# Patient Record
Sex: Female | Born: 1935 | Race: White | Hispanic: No | Smoking: Never smoker
Health system: Southern US, Community
[De-identification: ages and names within clinical notes are randomized; demographics above are authoritative.]

## PROBLEM LIST (undated history)

## (undated) DIAGNOSIS — M1990 Unspecified osteoarthritis, unspecified site: Secondary | ICD-10-CM

## (undated) DIAGNOSIS — E7800 Pure hypercholesterolemia, unspecified: Secondary | ICD-10-CM

## (undated) DIAGNOSIS — I10 Essential (primary) hypertension: Secondary | ICD-10-CM

## (undated) DIAGNOSIS — E119 Type 2 diabetes mellitus without complications: Secondary | ICD-10-CM

## (undated) HISTORY — PX: CHOLECYSTECTOMY: SHX55

---

## 2008-06-20 ENCOUNTER — Emergency Department (HOSPITAL_COMMUNITY): Admission: EM | Admit: 2008-06-20 | Discharge: 2008-06-20 | Payer: Self-pay | Admitting: Emergency Medicine

## 2008-06-28 ENCOUNTER — Ambulatory Visit (HOSPITAL_COMMUNITY): Admission: RE | Admit: 2008-06-28 | Discharge: 2008-06-28 | Payer: Self-pay | Admitting: Orthopedic Surgery

## 2010-05-04 ENCOUNTER — Encounter: Payer: Self-pay | Admitting: Orthopedic Surgery

## 2010-07-24 LAB — URINALYSIS, ROUTINE W REFLEX MICROSCOPIC
Glucose, UA: NEGATIVE mg/dL
Protein, ur: NEGATIVE mg/dL
Specific Gravity, Urine: 1.017 (ref 1.005–1.030)
Urobilinogen, UA: 0.2 mg/dL (ref 0.0–1.0)

## 2010-07-24 LAB — URINE MICROSCOPIC-ADD ON

## 2015-03-31 ENCOUNTER — Inpatient Hospital Stay (HOSPITAL_COMMUNITY): Payer: MEDICARE | Admitting: Anesthesiology

## 2015-03-31 ENCOUNTER — Encounter (HOSPITAL_COMMUNITY): Payer: Self-pay | Admitting: Emergency Medicine

## 2015-03-31 ENCOUNTER — Emergency Department (HOSPITAL_COMMUNITY): Payer: MEDICARE

## 2015-03-31 ENCOUNTER — Inpatient Hospital Stay (HOSPITAL_COMMUNITY)
Admission: EM | Admit: 2015-03-31 | Discharge: 2015-04-14 | DRG: 252 | Disposition: E | Payer: MEDICARE | Attending: Internal Medicine | Admitting: Internal Medicine

## 2015-03-31 ENCOUNTER — Encounter (HOSPITAL_COMMUNITY): Admission: EM | Disposition: E | Payer: Self-pay | Source: Home / Self Care | Attending: Pulmonary Disease

## 2015-03-31 DIAGNOSIS — I743 Embolism and thrombosis of arteries of the lower extremities: Principal | ICD-10-CM | POA: Diagnosis present

## 2015-03-31 DIAGNOSIS — I10 Essential (primary) hypertension: Secondary | ICD-10-CM | POA: Diagnosis present

## 2015-03-31 DIAGNOSIS — D649 Anemia, unspecified: Secondary | ICD-10-CM | POA: Diagnosis present

## 2015-03-31 DIAGNOSIS — R740 Nonspecific elevation of levels of transaminase and lactic acid dehydrogenase [LDH]: Secondary | ICD-10-CM | POA: Diagnosis present

## 2015-03-31 DIAGNOSIS — R0600 Dyspnea, unspecified: Secondary | ICD-10-CM | POA: Insufficient documentation

## 2015-03-31 DIAGNOSIS — M79605 Pain in left leg: Secondary | ICD-10-CM | POA: Diagnosis present

## 2015-03-31 DIAGNOSIS — E872 Acidosis: Secondary | ICD-10-CM | POA: Diagnosis present

## 2015-03-31 DIAGNOSIS — E1151 Type 2 diabetes mellitus with diabetic peripheral angiopathy without gangrene: Secondary | ICD-10-CM | POA: Diagnosis present

## 2015-03-31 DIAGNOSIS — E1165 Type 2 diabetes mellitus with hyperglycemia: Secondary | ICD-10-CM | POA: Diagnosis present

## 2015-03-31 DIAGNOSIS — N390 Urinary tract infection, site not specified: Secondary | ICD-10-CM | POA: Diagnosis present

## 2015-03-31 DIAGNOSIS — R6521 Severe sepsis with septic shock: Secondary | ICD-10-CM | POA: Diagnosis not present

## 2015-03-31 DIAGNOSIS — D72829 Elevated white blood cell count, unspecified: Secondary | ICD-10-CM | POA: Diagnosis present

## 2015-03-31 DIAGNOSIS — G9341 Metabolic encephalopathy: Secondary | ICD-10-CM | POA: Diagnosis not present

## 2015-03-31 DIAGNOSIS — B9620 Unspecified Escherichia coli [E. coli] as the cause of diseases classified elsewhere: Secondary | ICD-10-CM | POA: Diagnosis present

## 2015-03-31 DIAGNOSIS — A419 Sepsis, unspecified organism: Secondary | ICD-10-CM | POA: Diagnosis present

## 2015-03-31 DIAGNOSIS — N170 Acute kidney failure with tubular necrosis: Secondary | ICD-10-CM | POA: Diagnosis not present

## 2015-03-31 DIAGNOSIS — I350 Nonrheumatic aortic (valve) stenosis: Secondary | ICD-10-CM | POA: Diagnosis not present

## 2015-03-31 DIAGNOSIS — Z7982 Long term (current) use of aspirin: Secondary | ICD-10-CM

## 2015-03-31 DIAGNOSIS — I7090 Unspecified atherosclerosis: Secondary | ICD-10-CM

## 2015-03-31 DIAGNOSIS — N179 Acute kidney failure, unspecified: Secondary | ICD-10-CM | POA: Diagnosis present

## 2015-03-31 DIAGNOSIS — E11649 Type 2 diabetes mellitus with hypoglycemia without coma: Secondary | ICD-10-CM | POA: Diagnosis not present

## 2015-03-31 DIAGNOSIS — E7800 Pure hypercholesterolemia, unspecified: Secondary | ICD-10-CM | POA: Diagnosis present

## 2015-03-31 DIAGNOSIS — J9601 Acute respiratory failure with hypoxia: Secondary | ICD-10-CM | POA: Diagnosis not present

## 2015-03-31 DIAGNOSIS — E119 Type 2 diabetes mellitus without complications: Secondary | ICD-10-CM

## 2015-03-31 DIAGNOSIS — I480 Paroxysmal atrial fibrillation: Secondary | ICD-10-CM | POA: Diagnosis not present

## 2015-03-31 DIAGNOSIS — I4891 Unspecified atrial fibrillation: Secondary | ICD-10-CM | POA: Diagnosis present

## 2015-03-31 DIAGNOSIS — J189 Pneumonia, unspecified organism: Secondary | ICD-10-CM | POA: Diagnosis present

## 2015-03-31 DIAGNOSIS — T829XXA Unspecified complication of cardiac and vascular prosthetic device, implant and graft, initial encounter: Secondary | ICD-10-CM

## 2015-03-31 DIAGNOSIS — R579 Shock, unspecified: Secondary | ICD-10-CM | POA: Insufficient documentation

## 2015-03-31 DIAGNOSIS — E860 Dehydration: Secondary | ICD-10-CM | POA: Diagnosis present

## 2015-03-31 DIAGNOSIS — I8290 Acute embolism and thrombosis of unspecified vein: Secondary | ICD-10-CM

## 2015-03-31 DIAGNOSIS — I998 Other disorder of circulatory system: Secondary | ICD-10-CM | POA: Diagnosis present

## 2015-03-31 DIAGNOSIS — Z66 Do not resuscitate: Secondary | ICD-10-CM | POA: Diagnosis not present

## 2015-03-31 DIAGNOSIS — M1990 Unspecified osteoarthritis, unspecified site: Secondary | ICD-10-CM | POA: Diagnosis present

## 2015-03-31 DIAGNOSIS — I248 Other forms of acute ischemic heart disease: Secondary | ICD-10-CM | POA: Diagnosis present

## 2015-03-31 DIAGNOSIS — R7401 Elevation of levels of liver transaminase levels: Secondary | ICD-10-CM | POA: Insufficient documentation

## 2015-03-31 HISTORY — PX: EMBOLECTOMY: SHX44

## 2015-03-31 HISTORY — DX: Pure hypercholesterolemia, unspecified: E78.00

## 2015-03-31 HISTORY — DX: Unspecified osteoarthritis, unspecified site: M19.90

## 2015-03-31 HISTORY — DX: Type 2 diabetes mellitus without complications: E11.9

## 2015-03-31 HISTORY — DX: Essential (primary) hypertension: I10

## 2015-03-31 LAB — CBC WITH DIFFERENTIAL/PLATELET
BASOS ABS: 0 10*3/uL (ref 0.0–0.1)
Basophils Relative: 0 %
Eosinophils Absolute: 0 10*3/uL (ref 0.0–0.7)
Eosinophils Relative: 0 %
HEMATOCRIT: 34.2 % — AB (ref 36.0–46.0)
Hemoglobin: 10.8 g/dL — ABNORMAL LOW (ref 12.0–15.0)
LYMPHS PCT: 5 %
Lymphs Abs: 1 10*3/uL (ref 0.7–4.0)
MCH: 27.6 pg (ref 26.0–34.0)
MCHC: 31.6 g/dL (ref 30.0–36.0)
MCV: 87.2 fL (ref 78.0–100.0)
MONO ABS: 1.1 10*3/uL — AB (ref 0.1–1.0)
Monocytes Relative: 5 %
NEUTROS ABS: 18.3 10*3/uL — AB (ref 1.7–7.7)
Neutrophils Relative %: 90 %
PLATELETS: 279 10*3/uL (ref 150–400)
RBC: 3.92 MIL/uL (ref 3.87–5.11)
RDW: 13.7 % (ref 11.5–15.5)
WBC: 20.4 10*3/uL — ABNORMAL HIGH (ref 4.0–10.5)

## 2015-03-31 LAB — PREPARE RBC (CROSSMATCH)

## 2015-03-31 LAB — I-STAT TROPONIN, ED: TROPONIN I, POC: 0.07 ng/mL (ref 0.00–0.08)

## 2015-03-31 LAB — ABO/RH: ABO/RH(D): A POS

## 2015-03-31 SURGERY — EMBOLECTOMY
Anesthesia: General | Site: Leg Upper | Laterality: Left

## 2015-03-31 MED ORDER — CEFAZOLIN SODIUM-DEXTROSE 2-3 GM-% IV SOLR
INTRAVENOUS | Status: AC
  Filled 2015-03-31: qty 50

## 2015-03-31 MED ORDER — OXYCODONE-ACETAMINOPHEN 5-325 MG PO TABS: 1.0000 | ORAL_TABLET | ORAL | Status: DC | PRN

## 2015-03-31 MED ORDER — ENALAPRIL MALEATE 20 MG PO TABS: 20.0000 mg | ORAL_TABLET | Freq: Two times a day (BID) | ORAL | Status: DC

## 2015-03-31 MED ORDER — ONDANSETRON HCL 4 MG/2ML IJ SOLN: 4.0000 mg | Freq: Three times a day (TID) | INTRAMUSCULAR | Status: DC | PRN

## 2015-03-31 MED ORDER — 0.9 % SODIUM CHLORIDE (POUR BTL) OPTIME
TOPICAL | Status: DC | PRN
  Administered 2015-03-31: 2000 mL

## 2015-03-31 MED ORDER — DEXTROSE 5 % IV SOLN
10.0000 mg | INTRAVENOUS | Status: DC | PRN
  Administered 2015-03-31: 10 ug/min via INTRAVENOUS

## 2015-03-31 MED ORDER — MORPHINE SULFATE (PF) 2 MG/ML IV SOLN: 2.0000 mg | INTRAVENOUS | Status: DC | PRN

## 2015-03-31 MED ORDER — SODIUM CHLORIDE 0.9 % IV SOLN: INTRAVENOUS | Status: DC

## 2015-03-31 MED ORDER — DILTIAZEM HCL 25 MG/5ML IV SOLN
15.0000 mg | Freq: Once | INTRAVENOUS | Status: AC
  Administered 2015-03-31: 15 mg via INTRAVENOUS
  Filled 2015-03-31: qty 5

## 2015-03-31 MED ORDER — ACETAMINOPHEN 325 MG PO TABS: 650.0000 mg | ORAL_TABLET | Freq: Four times a day (QID) | ORAL | Status: DC | PRN

## 2015-03-31 MED ORDER — SODIUM CHLORIDE 0.9 % IJ SOLN
3.0000 mL | Freq: Two times a day (BID) | INTRAMUSCULAR | Status: DC
  Administered 2015-04-01 (×2): 3 mL via INTRAVENOUS

## 2015-03-31 MED ORDER — LACTATED RINGERS IV SOLN
INTRAVENOUS | Status: DC | PRN
  Administered 2015-03-31 – 2015-04-01 (×2): via INTRAVENOUS

## 2015-03-31 MED ORDER — LACTATED RINGERS IV SOLN
INTRAVENOUS | Status: DC | PRN
  Administered 2015-03-31: 23:00:00 via INTRAVENOUS

## 2015-03-31 MED ORDER — CEFAZOLIN SODIUM-DEXTROSE 2-3 GM-% IV SOLR
INTRAVENOUS | Status: DC | PRN
  Administered 2015-03-31: 2 g via INTRAVENOUS

## 2015-03-31 MED ORDER — GALANTAMINE HYDROBROMIDE ER 8 MG PO CP24
16.0000 mg | ORAL_CAPSULE | Freq: Every day | ORAL | Status: DC
  Filled 2015-03-31 (×2): qty 2

## 2015-03-31 MED ORDER — HEPARIN (PORCINE) IN NACL 100-0.45 UNIT/ML-% IJ SOLN
650.0000 [IU]/h | INTRAMUSCULAR | Status: DC
  Filled 2015-03-31: qty 250

## 2015-03-31 MED ORDER — ATORVASTATIN CALCIUM 10 MG PO TABS
10.0000 mg | ORAL_TABLET | Freq: Every day | ORAL | Status: DC
  Filled 2015-03-31 (×2): qty 1

## 2015-03-31 MED ORDER — FENTANYL CITRATE (PF) 100 MCG/2ML IJ SOLN
25.0000 ug | INTRAMUSCULAR | Status: AC
  Administered 2015-03-31: 25 ug via INTRAVENOUS
  Filled 2015-03-31: qty 2

## 2015-03-31 MED ORDER — SUCCINYLCHOLINE CHLORIDE 20 MG/ML IJ SOLN
INTRAMUSCULAR | Status: DC | PRN
  Administered 2015-03-31: 100 mg via INTRAVENOUS

## 2015-03-31 MED ORDER — CEFAZOLIN SODIUM 1-5 GM-% IV SOLN: 1.0000 g | INTRAVENOUS | Status: AC

## 2015-03-31 MED ORDER — DILTIAZEM HCL 100 MG IV SOLR
5.0000 mg/h | Freq: Once | INTRAVENOUS | Status: AC
  Administered 2015-03-31: 10 mg/h via INTRAVENOUS

## 2015-03-31 MED ORDER — HYDRALAZINE HCL 20 MG/ML IJ SOLN: 5.0000 mg | INTRAMUSCULAR | Status: DC | PRN

## 2015-03-31 MED ORDER — THROMBIN 20000 UNITS EX SOLR
CUTANEOUS | Status: AC
  Filled 2015-03-31: qty 20000

## 2015-03-31 MED ORDER — ETOMIDATE 2 MG/ML IV SOLN
INTRAVENOUS | Status: DC | PRN
  Administered 2015-03-31: 12 mg via INTRAVENOUS

## 2015-03-31 MED ORDER — INSULIN ASPART 100 UNIT/ML ~~LOC~~ SOLN: 0.0000 [IU] | Freq: Three times a day (TID) | SUBCUTANEOUS | Status: DC

## 2015-03-31 MED ORDER — FENTANYL CITRATE (PF) 250 MCG/5ML IJ SOLN
INTRAMUSCULAR | Status: DC | PRN
  Administered 2015-03-31 (×3): 50 ug via INTRAVENOUS

## 2015-03-31 MED ORDER — VASOPRESSIN 20 UNIT/ML IV SOLN
INTRAVENOUS | Status: DC | PRN
  Administered 2015-03-31 (×2): 1 [IU] via INTRAVENOUS

## 2015-03-31 MED ORDER — CALCIUM CHLORIDE 10 % IV SOLN
INTRAVENOUS | Status: DC | PRN
  Administered 2015-03-31 (×8): 100 mg via INTRAVENOUS

## 2015-03-31 MED ORDER — ALBUMIN HUMAN 5 % IV SOLN
INTRAVENOUS | Status: DC | PRN
  Administered 2015-03-31 (×2): via INTRAVENOUS

## 2015-03-31 MED ORDER — AMLODIPINE BESYLATE 5 MG PO TABS
5.0000 mg | ORAL_TABLET | Freq: Every day | ORAL | Status: DC
  Filled 2015-03-31: qty 1

## 2015-03-31 MED ORDER — FENTANYL CITRATE (PF) 250 MCG/5ML IJ SOLN
INTRAMUSCULAR | Status: AC
  Filled 2015-03-31: qty 5

## 2015-03-31 MED ORDER — METOPROLOL TARTRATE 1 MG/ML IV SOLN
5.0000 mg | INTRAVENOUS | Status: DC
  Filled 2015-03-31: qty 5

## 2015-03-31 MED ORDER — HEPARIN SODIUM (PORCINE) 1000 UNIT/ML IJ SOLN
INTRAMUSCULAR | Status: DC | PRN
  Administered 2015-03-31: 5000 [IU] via INTRAVENOUS

## 2015-03-31 MED ORDER — HEPARIN SODIUM (PORCINE) 5000 UNIT/ML IJ SOLN
INTRAMUSCULAR | Status: DC | PRN
  Administered 2015-03-31: 500 mL

## 2015-03-31 MED ORDER — PAPAVERINE HCL 30 MG/ML IJ SOLN
INTRAMUSCULAR | Status: AC
  Filled 2015-03-31: qty 2

## 2015-03-31 MED ORDER — ASPIRIN EC 81 MG PO TBEC
81.0000 mg | DELAYED_RELEASE_TABLET | Freq: Every day | ORAL | Status: DC
  Filled 2015-03-31: qty 1

## 2015-03-31 MED ORDER — CEFAZOLIN SODIUM-DEXTROSE 2-3 GM-% IV SOLR
INTRAVENOUS | Status: AC
  Filled 2015-03-31: qty 100

## 2015-03-31 MED ORDER — HEPARIN BOLUS VIA INFUSION
2000.0000 [IU] | Freq: Once | INTRAVENOUS | Status: DC
  Filled 2015-03-31: qty 2000

## 2015-03-31 MED ORDER — DEXTROSE 5 % IV SOLN
5.0000 mg/h | Freq: Once | INTRAVENOUS | Status: AC
  Administered 2015-03-31: 5 mg/h via INTRAVENOUS
  Filled 2015-03-31: qty 100

## 2015-03-31 MED ORDER — LIDOCAINE HCL 1 % IJ SOLN
INTRAMUSCULAR | Status: DC | PRN
  Administered 2015-03-31: 80 mg via INTRADERMAL

## 2015-03-31 MED ORDER — ACETAMINOPHEN 650 MG RE SUPP: 650.0000 mg | Freq: Four times a day (QID) | RECTAL | Status: DC | PRN

## 2015-03-31 MED ORDER — VASOPRESSIN 20 UNIT/ML IV SOLN
INTRAVENOUS | Status: AC
  Filled 2015-03-31: qty 1

## 2015-03-31 SURGICAL SUPPLY — 61 items
BANDAGE ELASTIC 4 VELCRO ST LF (GAUZE/BANDAGES/DRESSINGS) IMPLANT
BANDAGE ESMARK 6X9 LF (GAUZE/BANDAGES/DRESSINGS) IMPLANT
BNDG ESMARK 6X9 LF (GAUZE/BANDAGES/DRESSINGS)
CANISTER SUCTION 2500CC (MISCELLANEOUS) ×3 IMPLANT
CANNULA VESSEL 3MM 2 BLNT TIP (CANNULA) ×3 IMPLANT
CANNULA VESSEL W/WING W/VALVE (CANNULA) IMPLANT
CLIP TI MEDIUM 24 (CLIP) ×3 IMPLANT
CLIP TI WIDE RED SMALL 24 (CLIP) ×3 IMPLANT
CUFF TOURNIQUET SINGLE 24IN (TOURNIQUET CUFF) IMPLANT
CUFF TOURNIQUET SINGLE 34IN LL (TOURNIQUET CUFF) IMPLANT
CUFF TOURNIQUET SINGLE 44IN (TOURNIQUET CUFF) IMPLANT
DERMABOND ADVANCED (GAUZE/BANDAGES/DRESSINGS) ×2
DERMABOND ADVANCED .7 DNX12 (GAUZE/BANDAGES/DRESSINGS) ×1 IMPLANT
DRAIN CHANNEL 15F RND FF W/TCR (WOUND CARE) ×3 IMPLANT
DRAIN PENROSE 3/4X12 (DRAIN) IMPLANT
DRAPE X-RAY CASS 24X20 (DRAPES) IMPLANT
DRSG COVADERM 4X10 (GAUZE/BANDAGES/DRESSINGS) IMPLANT
DRSG COVADERM 4X8 (GAUZE/BANDAGES/DRESSINGS) IMPLANT
ELECT REM PT RETURN 9FT ADLT (ELECTROSURGICAL) ×3
ELECTRODE REM PT RTRN 9FT ADLT (ELECTROSURGICAL) ×1 IMPLANT
EVACUATOR SILICONE 100CC (DRAIN) ×3 IMPLANT
GLOVE BIO SURGEON STRL SZ7.5 (GLOVE) ×3 IMPLANT
GLOVE BIOGEL PI IND STRL 6.5 (GLOVE) ×1 IMPLANT
GLOVE BIOGEL PI IND STRL 7.0 (GLOVE) ×1 IMPLANT
GLOVE BIOGEL PI IND STRL 7.5 (GLOVE) ×1 IMPLANT
GLOVE BIOGEL PI IND STRL 8 (GLOVE) ×1 IMPLANT
GLOVE BIOGEL PI INDICATOR 6.5 (GLOVE) ×2
GLOVE BIOGEL PI INDICATOR 7.0 (GLOVE) ×2
GLOVE BIOGEL PI INDICATOR 7.5 (GLOVE) ×2
GLOVE BIOGEL PI INDICATOR 8 (GLOVE) ×2
GLOVE SURG SS PI 7.5 STRL IVOR (GLOVE) ×3 IMPLANT
GOWN STRL REUS W/ TWL LRG LVL3 (GOWN DISPOSABLE) ×2 IMPLANT
GOWN STRL REUS W/TWL LRG LVL3 (GOWN DISPOSABLE) ×4
KIT BASIN OR (CUSTOM PROCEDURE TRAY) ×3 IMPLANT
KIT ROOM TURNOVER OR (KITS) ×3 IMPLANT
MARKER GRAFT CORONARY BYPASS (MISCELLANEOUS) IMPLANT
NS IRRIG 1000ML POUR BTL (IV SOLUTION) ×6 IMPLANT
PACK PERIPHERAL VASCULAR (CUSTOM PROCEDURE TRAY) ×3 IMPLANT
PAD ARMBOARD 7.5X6 YLW CONV (MISCELLANEOUS) ×6 IMPLANT
PADDING CAST COTTON 6X4 STRL (CAST SUPPLIES) IMPLANT
PATCH VASC XENOSURE 1CMX6CM (Vascular Products) ×2 IMPLANT
PATCH VASC XENOSURE 1X6 (Vascular Products) ×1 IMPLANT
SET COLLECT BLD 21X3/4 12 (NEEDLE) IMPLANT
SPONGE SURGIFOAM ABS GEL 100 (HEMOSTASIS) IMPLANT
STAPLER VISISTAT (STAPLE) IMPLANT
STOPCOCK 4 WAY LG BORE MALE ST (IV SETS) IMPLANT
SUT ETHILON 3 0 PS 1 (SUTURE) ×3 IMPLANT
SUT PROLENE 5 0 C 1 24 (SUTURE) ×3 IMPLANT
SUT PROLENE 6 0 BV (SUTURE) ×6 IMPLANT
SUT PROLENE 7 0 BV 1 (SUTURE) IMPLANT
SUT SILK 2 0 FS (SUTURE) IMPLANT
SUT SILK 3 0 (SUTURE)
SUT SILK 3-0 18XBRD TIE 12 (SUTURE) IMPLANT
SUT VIC AB 2-0 CTB1 (SUTURE) ×3 IMPLANT
SUT VIC AB 3-0 SH 27 (SUTURE) ×2
SUT VIC AB 3-0 SH 27X BRD (SUTURE) ×1 IMPLANT
SUT VICRYL 4-0 PS2 18IN ABS (SUTURE) ×3 IMPLANT
TRAY FOLEY W/METER SILVER 16FR (SET/KITS/TRAYS/PACK) IMPLANT
TUBING EXTENTION W/L.L. (IV SETS) IMPLANT
UNDERPAD 30X30 INCONTINENT (UNDERPADS AND DIAPERS) ×3 IMPLANT
WATER STERILE IRR 1000ML POUR (IV SOLUTION) ×3 IMPLANT

## 2015-03-31 NOTE — H&P (Addendum)
[facility] History and Physical  Cynthia Montes:[id number] DOB: 1923/06/10 DOA: 04/11/2015  Referring physician: ED physician PCP: No primary care provider on file.  Specialists:   Chief Complaint: left leg pain  HPI: Cynthia Montes is a 81 y.o. female with PMH of hypertension, hyperlipidemia, diabetes mellitus, arthritis, who presents with left leg pain.  Pt reports that she suddenly started having left leg pain at approximately 3:30 this afternoon. This initially appeared to involve her knee and thigh, then become more severe in her L lower leg. Patient does not have chest pain, shortness of breath, cough, abdominal pain, diarrhea, unilateral weakness. She has a generalized weakness. Her left leg is cool and pale.   In ED, patient was found to have WBC 20.4, temperature normal, tachycardia, acute renal injury, troponin 0.10, new onset atrial fibrillation on EKG. duplex scan done by the emergency department suggested possible clot in the left femoral artery. Vascular surgeon was consulted urgently.   Where does patient live?   At home   Can patient participate in ADLs? Barely   Review of Systems:   General: no fevers, chills, no changes in body weight, has fatigue HEENT: no blurry vision, hearing changes or sore throat Pulm: no dyspnea, coughing, wheezing CV: no chest pain, palpitations Abd: no nausea, vomiting, abdominal pain, diarrhea, constipation GU: no dysuria, burning on urination, increased urinary frequency, hematuria  Ext: has left leg pain. No leg edema. Neuro: no unilateral weakness, numbness, or tingling, no vision change or hearing loss Skin: no rash MSK: No muscle spasm, no deformity, no limitation of range of movement in spin Heme: No easy bruising.  Travel history: No recent long distant travel.  Allergy: No Known Allergies  Past Medical History  Diagnosis Date  . Diabetes mellitus without complication (HCC)   . Hypertension   . Elevated cholesterol    . Arthritis     Past Surgical History  Procedure Laterality Date  . Cholecystectomy      Social History:  reports that she has never smoked. She does not have any smokeless tobacco history on file. She reports that she does not drink alcohol. Her drug history is not on file.  Family History:  Family History  Problem Relation Age of Onset  . Multiple sclerosis Father      Prior to Admission medications   Not on File    Physical Exam: Filed Vitals:   04/01/15 0100 04/01/15 0115 04/01/15 0130 04/01/15 0143  BP: 110/91 110/64 110/87 110/87  Pulse: 123 144  124  Temp:      TempSrc:      Resp: Height:      Weight:      SpO2: 95% 96%  93%   General: Not in acute distress HEENT:       Eyes: PERRL, EOMI, no scleral icterus.       ENT: No discharge from the ears and nose, no pharynx injection, no tonsillar enlargement.        Neck: No JVD, no bruit, no mass felt. Heme: No neck lymph node enlargement. Cardiac: S1/S2, RRR, No murmurs, No gallops or rubs. Pulm: No rales, wheezing, rhonchi or rubs. Abd: Soft, nondistended, nontender, no rebound pain, no organomegaly, BS present. Ext: No pitting leg edema bilaterally. DP/PT pulse are not palpable bilaterally. Both legs and feet are cool, left is worse than the right. Left leg and foot are pale, the right leg and foot are pink. Musculoskeletal: No joint deformities,  No joint redness or warmth, no limitation of ROM in spin. Skin: No rashes.  Neuro: Alert, oriented X3, cranial nerves II-XII grossly intact, muscle strength 5/5 in all extremities, sensation to light touch intact.  Psych: Patient is not psychotic, no suicidal or hemocidal ideation.  Labs on Admission:  Basic Metabolic Panel:  Recent Labs Lab 04/30/2015 2336 04/01/15 0006  NA 131* 135  K 5.0 4.9  CL 104  --   CO2 14*  --   GLUCOSE 426*  --   BUN 24*  --   CREATININE 1.74*  --   CALCIUM 8.0*  --    Liver Function Tests: No results for input(s):  AST, ALT, ALKPHOS, BILITOT, PROT, ALBUMIN in the last 168 hours. No results for input(s): LIPASE, AMYLASE in the last 168 hours. No results for input(s): AMMONIA in the last 168 hours. CBC:  Recent Labs Lab 2015/04/30 2121 04/01/15 0006  WBC 20.4*  --   NEUTROABS 18.3*  --   HGB 10.8* 8.5*  HCT 34.2* 25.0*  MCV 87.2  --   PLT 279  --    Cardiac Enzymes:  Recent Labs Lab 04/30/2015 2336  TROPONINI 0.10*    BNP (last 3 results) No results for input(s): BNP in the last 8760 hours.  ProBNP (last 3 results) No results for input(s): PROBNP in the last 8760 hours.  CBG:  Recent Labs Lab 04/01/15 0047  GLUCAP 379*    Radiological Exams on Admission: Dg Chest Portable 1 View  April 30, 2015  CLINICAL DATA:  Severe left leg pain from hip to knee EXAM: PORTABLE CHEST 1 VIEW COMPARISON:  None. FINDINGS: Normal heart size and mediastinal contours. There is interstitial coarsening at the bases with subtle Kerley lines seen peripherally. No effusion or air leak. Symmetric biapical pleural scarring. IMPRESSION: Mild bronchitic or congestive interstitial coarsening. Electronically Signed   By: Marnee Spring M.D.   On: 30-Apr-2015 22:15    EKG: Independently reviewed.  QTC 401, RAD, new onset atrial fibrillation  Assessment/Plan Principal Problem:   Arterial embolism of left leg (HCC) Active Problems:   Diabetes mellitus without complication (HCC)   Hypertension   Elevated cholesterol   Arthritis   New onset a-fib (HCC)   Atrial fibrillation with RVR (HCC)   Leukocytosis   Atrial fibrillation (HCC)   AKI (acute kidney injury) (HCC)  Addendum: pt developed hypotension after the surgery. - continue NS bolus for 3rd liter -PCCM, Dr. Vaughan Basta was consulted.   Left leg pain: given her new onset of A fib and sudden onset left pain without pulse, most likely due to embolic event per vascular surgeon, Dr. Edilia Bo. Patient will be taken to OR for urgent surgery by Dr. Jen Mow. IV  heparin was started. She will need to be on heparin postoperatively per Dr. Edilia Bo. -will admit to SDU -follow up Dr. Adele Dan recommendations -IV heparin per pharmacy -When necessary Percocet and morphine for pain; Zofran for nausea  New onset atrial Fibrillation with RVR: CHA2DS2-VASc Score is 6, needs oral anticoagulation. HR is up to 130-140 in ED. hemodynamically stable. Patient does not have chest pain. -IV Cardizem drip -IV heparin -check TSH, Free t4 and t3  Elevated trop: trop 0.10. No chest pain. It is likely due to demanding ischemic secondary to A. fib with RVR and stress from left leg ischemia.  - cycle CE q6 x3 and repeat her EKG in the am  - prn Morphine for pain, and aspirin, lipitor  - Risk factor stratification: will check FLP,  A1c - 2d echo - Please call Card in AM for recommendations  DM-II: Last A1c not on record, Patient is taking Amaryl at home. Blood sugar 426 on admission -Started Lantus 3 units daily -SSI -Check A1c  HTN: -Hold enalapril due to acute renal injury -Hydralazine when necessary -On Cardizem drip -Continue amlodipine  HLD: Last LDL was noted on record. Patient is taking Lipitor at home  -Continue home medications: Lipitor -Check FLP  AKI: Likely due to prerenal secondary to dehydration and continuation of ACEI - IVF: NS 75 cc/h - Check FeNa - US-renal in AM - Follow up renal function by BMP - Hold enalapril  Leukocytosis: no signs of infection. Likely due to stress induced to demargination -will follow up blood culture and UA -follow up by CBC  DVT ppx: On IV heparin Code Status: Full code Family Communication: Yes, patient's son at bed side Disposition Plan: Admit to inpatient   Date of Service 04/01/2015    Lorretta HarpIU, Fantashia Shupert [facility] Pager [phone]  If 7PM-7AM, please contact night-coverage [email or web address] Password [email or web address] 04/01/2015, 1:44 AM

## 2015-03-31 NOTE — Anesthesia Preprocedure Evaluation (Signed)
Anesthesia Evaluation  Patient identified by MRN, date of birth, ID band Patient awake    Reviewed: Allergy & Precautions, NPO status , Patient's Chart, lab work & pertinent test results  Airway Mallampati: II  TM Distance: >3 FB Neck ROM: Full    Dental no notable dental hx.    Pulmonary neg pulmonary ROS,    Pulmonary exam normal breath sounds clear to auscultation       Cardiovascular hypertension, + Peripheral Vascular Disease  Normal cardiovascular exam Rhythm:Regular Rate:Normal     Neuro/Psych negative neurological ROS  negative psych ROS   GI/Hepatic negative GI ROS, Neg liver ROS,   Endo/Other  negative endocrine ROSdiabetes  Renal/GU negative Renal ROS     Musculoskeletal negative musculoskeletal ROS (+) Arthritis ,   Abdominal   Peds  Hematology negative hematology ROS (+)   Anesthesia Other Findings   Reproductive/Obstetrics negative OB ROS                             Anesthesia Physical Anesthesia Plan  ASA: IV  Anesthesia Plan: General   Post-op Pain Management:    Induction: Intravenous, Rapid sequence and Cricoid pressure planned  Airway Management Planned: Oral ETT  Additional Equipment: Arterial line  Intra-op Plan:   Post-operative Plan: Extubation in OR  Informed Consent: I have reviewed the patients History and Physical, chart, labs and discussed the procedure including the risks, benefits and alternatives for the proposed anesthesia with the patient or authorized representative who has indicated his/her understanding and acceptance.   Dental advisory given  Plan Discussed with: CRNA  Anesthesia Plan Comments:         Anesthesia Quick Evaluation

## 2015-03-31 NOTE — ED Notes (Signed)
Vascular surgeon at the bedside.  

## 2015-03-31 NOTE — ED Provider Notes (Signed)
CSN: [id number]     Arrival date & time 14-Apr-2015  2042 History   First MD Initiated Contact with Patient 14-Apr-2015 2058     Chief Complaint  Patient presents with  . Leg Pain   81 yo F w/PMH of HTN, HLD, and DM2 who presents today for acute onset left leg/knee/thigh pain. Pt was ambulating normally today when suddenly around 3PM began complaining of severe pain in her left leg. Family notes that she has had minor pain in her knees before but nothing like this. She describes it as aching and radiating up her thigh to her low back. She denies CP, SOB, fever, chills, N/V, diarrhea, constipation, hematemesis, dysuria, hematuria, sick contacts, or recent travel.   Patient is a 81 y.o. female presenting with leg pain.  Leg Pain Location:  Leg and knee Time since incident:  5 hours Injury: no   Leg location:  L leg and L upper leg Pain details:    Quality:  Aching   Radiates to:  Back   Severity:  Severe   Onset quality:  Sudden   Timing:  Constant   Progression:  Unchanged Chronicity:  New Prior injury to area:  No Relieved by:  Nothing Worsened by:  Nothing tried Associated symptoms: back pain (left lower back)   Associated symptoms: no fever     Past Medical History  Diagnosis Date  . Diabetes mellitus without complication (HCC)   . Hypertension   . Elevated cholesterol   . Arthritis    Past Surgical History  Procedure Laterality Date  . Cholecystectomy     History reviewed. No pertinent family history. Social History  Substance Use Topics  . Smoking status: Never Smoker   . Smokeless tobacco: None  . Alcohol Use: No   OB History    No data available     Review of Systems  Constitutional: Negative for fever and chills.  Respiratory: Positive for shortness of breath.   Cardiovascular: Negative for chest pain, palpitations and leg swelling.  Gastrointestinal: Negative for nausea, vomiting, abdominal pain, diarrhea, constipation and abdominal distention.    Genitourinary: Negative for dysuria, frequency, flank pain and decreased urine volume.  Musculoskeletal: Positive for back pain (left lower back).       Left leg pain, left knee pain  Neurological: Negative for dizziness, speech difficulty, light-headedness and headaches.  All other systems reviewed and are negative.     Allergies  Review of patient's allergies indicates no known allergies.  Home Medications   Prior to Admission medications   Medication Sig Start Date End Date Taking? Authorizing Provider  amLODipine (NORVASC) 5 MG tablet Take 5 mg by mouth daily.   Yes Historical Provider, MD  aspirin EC 81 MG tablet Take 81 mg by mouth daily.   Yes Historical Provider, MD  atorvastatin (LIPITOR) 10 MG tablet Take 10 mg by mouth daily.   Yes Historical Provider, MD  enalapril (VASOTEC) 20 MG tablet Take 20 mg by mouth 2 (two) times daily.   Yes Historical Provider, MD  galantamine (RAZADYNE ER) 16 MG 24 hr capsule Take 16 mg by mouth daily with breakfast.   Yes Historical Provider, MD  glimepiride (AMARYL) 1 MG tablet Take 1 mg by mouth daily with breakfast.   Yes Historical Provider, MD  naproxen sodium (ANAPROX) 220 MG tablet Take 440 mg by mouth daily as needed (general pain).   Yes Historical Provider, MD   BP 95/80 mmHg  Pulse 64  Temp(Src) 98.5 F (36.9  C) (Oral)  Resp 33  Ht 5\' 8"  (1.727 m)  Wt 54.432 kg  BMI 18.25 kg/m2  SpO2 93%  LMP  Physical Exam  Constitutional: She is oriented to person, place, and time. She appears well-developed and well-nourished. She appears distressed.  HENT:  Head: Normocephalic and atraumatic.  Cardiovascular: Normal heart sounds and intact distal pulses.  Exam reveals no gallop and no friction rub.   No murmur heard. tachy  Pulmonary/Chest: Effort normal. No respiratory distress. She has no wheezes. She has no rales. She exhibits no tenderness.  Basilar crackles  Abdominal: Soft. Bowel sounds are normal. She exhibits no distension  and no mass. There is no tenderness. There is no rebound and no guarding.  Musculoskeletal: Normal range of motion. She exhibits tenderness (left thigh/posterior).       Legs: Lymphadenopathy:    She has no cervical adenopathy.  Neurological: She is alert and oriented to person, place, and time.  Skin: Skin is warm and dry. She is not diaphoretic.  Nursing note and vitals reviewed.   ED Course  .Critical Care Performed by: Rachelle HoraSMITH, Darick Fetters Authorized by: Rachelle HoraSMITH, Vinicio Lynk Total critical care time: 30 minutes Critical care time was exclusive of separately billable procedures and treating other patients. Critical care was necessary to treat or prevent imminent or life-threatening deterioration of the following conditions: acute arterial clot with AFib RVR. Critical care was time spent personally by me on the following activities: ordering and performing treatments and interventions, re-evaluation of patient's condition, discussions with consultants, evaluation of patient's response to treatment, ordering and review of laboratory studies and examination of patient. Subsequent provider of critical care: I assumed direction of critical care for this patient from another provider of my specialty.   (including critical care time) Labs Review Labs Reviewed  CBC WITH DIFFERENTIAL/PLATELET - Abnormal; Notable for the following:    WBC 20.4 (*)    Hemoglobin 10.8 (*)    HCT 34.2 (*)    Neutro Abs 18.3 (*)    Monocytes Absolute 1.1 (*)    All other components within normal limits  URINALYSIS, ROUTINE W REFLEX MICROSCOPIC (NOT AT [facility])  PROTIME-INR  HEPARIN LEVEL (UNFRACTIONATED)  CBC  BASIC METABOLIC PANEL  I-STAT TROPOININ, ED    Imaging Review No results found. I have personally reviewed and evaluated these images and lab results as part of my medical decision-making.   EKG Interpretation   Date/Time:  Sunday March 31 2015 21:18:13 EST Ventricular Rate:  186 PR Interval:    QRS  Duration: 84 QT Interval:  228 QTC Calculation: 401 R Axis:   93 Text Interpretation:  Atrial fibrillation with rapid V-rate Paired  ventricular premature complexes Right axis deviation Abnormal lateral Q  waves ST depression, probably rate related Baseline wander in lead(s) II  Confirmed by ZAVITZ  MD, JOSHUA ([id number]) on 04/04/2015 9:50:10 PM      MDM   Final diagnoses:  Atrial fibrillation with RVR (HCC)  Dyspnea  Arterial occlusion (HCC)   81 year old female who presents with acute onset of left leg pain. Please see history of present illness for details. On exam patient in distress, claiming that her pain is horrific. She is tachycardic near 200. Straight leg raise positive for pain elicited in the posterior left thigh. No midline spine tenderness. Upon reinspection found to have a cool left lower extremity. Pulses not palpated. No pulses by Doppler. Bedside ultrasound utilized with no pulsatile flow. With increased heart rate and cool foot suspect A. fib with  embolic event. After surgery consultation immediately. Given Cardizem bolus and drip started for A. fib RVR. Heparin drip initiated.  Patient will be taken to vascular surgery immediately with admission to the stepdown unit with hospitalist afterward.  Pt was seen under the supervision of Dr. Jodi Mourning.     Rachelle Hora, MD 04/11/2015 1610  Blane Ohara, MD 04/04/15 0800

## 2015-03-31 NOTE — ED Notes (Signed)
Dr.Niu at bedsdie

## 2015-03-31 NOTE — Progress Notes (Signed)
ANTICOAGULATION CONSULT NOTE - Initial Consult  Pharmacy Consult for heparin  Indication: atrial fibrillation  No Known Allergies  Patient Measurements: Height: 5\' 8"  (172.7 cm) Weight: 120 lb (54.432 kg) IBW/kg (Calculated) : 63.9   Vital Signs: Temp: 98.5 F (36.9 C) (12/18 2052) Temp Source: Oral (12/18 2052) BP: 132/110 mmHg (12/18 2052) Pulse Rate: 79 (12/18 2108)  Labs:  Recent Labs  2014/09/08 2121  HGB 10.8*  HCT 34.2*  PLT 279    CrCl cannot be calculated (Patient has no serum creatinine result on file.).   Medical History: Past Medical History  Diagnosis Date  . Diabetes mellitus without complication (HCC)   . Hypertension   . Elevated cholesterol   . Arthritis     Assessment: 81 yo female with left leg pain and noteddistal arterial emboli to Left leg from new onset AFib. Pharmacy has been consulted to dose heparin.   Goal of Therapy:  Heparin level 0.3-0.7 units/ml Monitor platelets by anticoagulation protocol: Yes   Plan:  -Heparin bolus 2000 units IV followed by 650 units/hr (~ 12 units/kg/hr) -Heparin level in 8 hours and daily wth CBC daily  Harland Germanndrew Lynisha Osuch, Pharm D 04-13-15 9:44 PM

## 2015-03-31 NOTE — ED Notes (Signed)
Started having pain in left leg earlier today.  From hip down to knee.  Denies any injury but reports history of arthritis.  Per family knee has been bothering her for a number of years.  Occasionally uses a cane to walk.

## 2015-03-31 NOTE — Consult Note (Signed)
[facility]  Patient name: Cynthia Montes MRN: [id number] DOB: September 15, 1923 Sex: female  REASON FOR CONSULT: Ischemic left leg  HPI: Cynthia Montes is a 81 y.o. female, who developed left leg pain at approximately 3:30 this afternoon. This occurred suddenly and initially appeared to involve her knee and thigh. She now has pain in her lower leg. She was evaluated by the emergency department and was felt by duplex that she may have some soft clot in her femoral artery. She has atrial fibrillation which is new. She had a rapid ventricular response and has been given verapamil.  Prior to developing the acute onset of pain in her left leg today she denied any history of claudication although I think her activity is very limited given her age and debilitated state. She does have risk factors for vascular disease including diabetes, hypertension, and hyperlipidemia. Certainly with her age I would expect her to have underlying peripheral vascular disease.  Past Medical History  Diagnosis Date  . Diabetes mellitus without complication (HCC)   . Hypertension   . Elevated cholesterol   . Arthritis     No family history on file. There is no family history of premature cardiovascular disease. SOCIAL HISTORY: Social History   Social History  . Marital Status: Widowed    Spouse Name: N/A  . Number of Children: N/A  . Years of Education: N/A   Occupational History  . Not on file.   Social History Main Topics  . Smoking status: Never Smoker   . Smokeless tobacco: Not on file  . Alcohol Use: No  . Drug Use: Not on file  . Sexual Activity: Not on file   Other Topics Concern  . Not on file   Social History Narrative  . No narrative on file    No Known Allergies  Current Facility-Administered Medications  Medication Dose Route Frequency Provider Last Rate Last Dose  . [START ON 04/01/2015] ceFAZolin (ANCEF) IVPB 1 g/50 mL premix  1 g Intravenous On Call  Chuck Hint, MD      . diltiazem (CARDIZEM) 100 mg in dextrose 5 % 100 mL (1 mg/mL) infusion  5-15 mg/hr Intravenous Once Blane Ohara, MD      . heparin ADULT infusion 100 units/mL (25000 units/250 mL)  650 Units/hr Intravenous Continuous Silvana Newness, RPH      . heparin bolus via infusion 2,000 Units  2,000 Units Intravenous Once Silvana Newness, [facility]      . metoprolol (LOPRESSOR) injection 5 mg  5 mg Intravenous STAT Rachelle Hora, MD   5 mg at 2015/04/03 2124   No current outpatient prescriptions on file.    REVIEW OF SYSTEMS:   denotes positive finding,  denotes negative finding Cardiac  Comments:  Chest pain or chest pressure:    Shortness of breath upon exertion:    Short of breath when lying flat:    Irregular heart rhythm: X       Vascular    Pain in calf, thigh, or hip brought on by ambulation:    Pain in feet at night that wakes you up from your sleep:     Blood clot in your veins:    Leg swelling:         Pulmonary    Oxygen at home:    Productive cough:  X   Wheezing:         Neurologic    Sudden weakness in arms  or legs:     Sudden numbness in arms or legs:  X   Sudden onset of difficulty speaking or slurred speech:    Temporary loss of vision in one eye:     Problems with dizziness:         Gastrointestinal    Blood in stool:     Vomited blood:         Genitourinary    Burning when urinating:     Blood in urine:        Psychiatric    Major depression:         Hematologic    Bleeding problems:    Problems with blood clotting too easily:        Skin    Rashes or ulcers:        Constitutional    Fever or chills:      PHYSICAL EXAM: Filed Vitals:   05-20-14 2052 05-20-14 2100 05-20-14 2108 05-20-14 2115  BP: 132/110   131/104  Pulse: 86  79 193  Temp: 98.5 F (36.9 C)     TempSrc: Oral     Resp: 18  19 30   Height: 5\' 8"  (1.727 m)     Weight: 120 lb (54.432 kg)     SpO2: 95% 95%  92%    GENERAL: The patient is a  well-nourished female, in no acute distress. The vital signs are documented above. CARDIAC: There is a regular rate and rhythm.  VASCULAR: I do not detect carotid bruits. On the right side she has a palpable femoral pulse. I cannot palpate pedal pulses. The right foot appears adequately perfused and is pink. On the left side, she has a diminished femoral pulse. I cannot palpate a popliteal or pedal pulses. The left foot is pale and cool. He has no significant lower extremity swelling. PULMONARY: There is good air exchange bilaterally without wheezing or rales. ABDOMEN: Soft and non-tender with normal pitched bowel sounds.  MUSCULOSKELETAL: There are no major deformities or cyanosis. NEUROLOGIC: She has diminished motor and sensory function in the left foot. SKIN: There are no ulcers or rashes noted. PSYCHIATRIC: The patient has a normal affect.  DATA:  Duplex scan done by the emergency department suggest possible clot in the left femoral artery.  MEDICAL ISSUES:  ACUTE ARTERIAL OCCLUSION LEFT LOWER EXTREMITY: This patient has a history consistent with an acute arterial occlusion of the left lower extremity. She has new onset atrial fibrillation and most likely this is an embolic event. I have recommended emergent femoral embolectomy. Given her age and risk factor she likely has underlying infrainguinal arterial occlusive disease. However I have explained to the family that I will try to keep this as simple as possible given her age and debilitated state which puts her at increased risk. We have discussed indications for surgery and the potential complications including the potential need for bypass or fasciotomy. We will proceed urgently. She will need to be on heparin postoperatively. She's being admitted by the medical service her workup of her rapid atrial fibrillation. The patient and the family understand that clearly this is a limb threatening situation.   Waverly Ferrariickson, [facility] [place] Beeper: [phone]

## 2015-03-31 NOTE — Anesthesia Procedure Notes (Signed)
Procedure Name: Intubation Date/Time: 08/13/14 11:23 PM Performed by: Brien MatesMAHONY, Marcelis Wissner D Pre-anesthesia Checklist: Patient identified, Emergency Drugs available, Suction available, Patient being monitored and Timeout performed Patient Re-evaluated:Patient Re-evaluated prior to inductionOxygen Delivery Method: Circle system utilized Preoxygenation: Pre-oxygenation with 100% oxygen Intubation Type: IV induction, Rapid sequence and Cricoid Pressure applied Laryngoscope Size: Miller and 2 Grade View: Grade I Tube type: Subglottic suction tube Tube size: 7.5 mm Number of attempts: 1 Airway Equipment and Method: Stylet Placement Confirmation: ETT inserted through vocal cords under direct vision,  positive ETCO2,  CO2 detector and breath sounds checked- equal and bilateral Secured at: 21 cm Tube secured with: Tape Dental Injury: Teeth and Oropharynx as per pre-operative assessment

## 2015-04-01 ENCOUNTER — Encounter (HOSPITAL_COMMUNITY): Payer: Self-pay | Admitting: Internal Medicine

## 2015-04-01 ENCOUNTER — Inpatient Hospital Stay (HOSPITAL_COMMUNITY): Payer: MEDICARE

## 2015-04-01 ENCOUNTER — Ambulatory Visit (HOSPITAL_COMMUNITY): Payer: MEDICARE

## 2015-04-01 DIAGNOSIS — A419 Sepsis, unspecified organism: Secondary | ICD-10-CM | POA: Insufficient documentation

## 2015-04-01 DIAGNOSIS — R579 Shock, unspecified: Secondary | ICD-10-CM | POA: Insufficient documentation

## 2015-04-01 DIAGNOSIS — I480 Paroxysmal atrial fibrillation: Secondary | ICD-10-CM

## 2015-04-01 DIAGNOSIS — E119 Type 2 diabetes mellitus without complications: Secondary | ICD-10-CM

## 2015-04-01 DIAGNOSIS — R7401 Elevation of levels of liver transaminase levels: Secondary | ICD-10-CM | POA: Insufficient documentation

## 2015-04-01 DIAGNOSIS — I350 Nonrheumatic aortic (valve) stenosis: Secondary | ICD-10-CM

## 2015-04-01 DIAGNOSIS — I4891 Unspecified atrial fibrillation: Secondary | ICD-10-CM | POA: Diagnosis present

## 2015-04-01 DIAGNOSIS — N179 Acute kidney failure, unspecified: Secondary | ICD-10-CM | POA: Insufficient documentation

## 2015-04-01 DIAGNOSIS — R6521 Severe sepsis with septic shock: Secondary | ICD-10-CM

## 2015-04-01 DIAGNOSIS — R740 Nonspecific elevation of levels of transaminase and lactic acid dehydrogenase [LDH]: Secondary | ICD-10-CM

## 2015-04-01 DIAGNOSIS — I743 Embolism and thrombosis of arteries of the lower extremities: Principal | ICD-10-CM

## 2015-04-01 DIAGNOSIS — N170 Acute kidney failure with tubular necrosis: Secondary | ICD-10-CM

## 2015-04-01 DIAGNOSIS — R0600 Dyspnea, unspecified: Secondary | ICD-10-CM | POA: Insufficient documentation

## 2015-04-01 DIAGNOSIS — J9601 Acute respiratory failure with hypoxia: Secondary | ICD-10-CM | POA: Insufficient documentation

## 2015-04-01 LAB — POCT I-STAT 7, (LYTES, BLD GAS, ICA,H+H)
ACID-BASE DEFICIT: 9 mmol/L — AB (ref 0.0–2.0)
Acid-base deficit: 15 mmol/L — ABNORMAL HIGH (ref 0.0–2.0)
BICARBONATE: 13.8 meq/L — AB (ref 20.0–24.0)
Bicarbonate: 17 mEq/L — ABNORMAL LOW (ref 20.0–24.0)
CALCIUM ION: 1.17 mmol/L (ref 1.13–1.30)
CALCIUM ION: 1.83 mmol/L — AB (ref 1.13–1.30)
HCT: 30 % — ABNORMAL LOW (ref 36.0–46.0)
HEMATOCRIT: 25 % — AB (ref 36.0–46.0)
HEMOGLOBIN: 10.2 g/dL — AB (ref 12.0–15.0)
Hemoglobin: 8.5 g/dL — ABNORMAL LOW (ref 12.0–15.0)
O2 SAT: 100 %
O2 Saturation: 98 %
PH ART: 7.303 — AB (ref 7.350–7.450)
PO2 ART: 139 mmHg — AB (ref 80.0–100.0)
PO2 ART: 427 mmHg — AB (ref 80.0–100.0)
Patient temperature: 36.5
Potassium: 4.9 mmol/L (ref 3.5–5.1)
Potassium: 4.9 mmol/L (ref 3.5–5.1)
SODIUM: 135 mmol/L (ref 135–145)
SODIUM: 135 mmol/L (ref 135–145)
TCO2: 15 mmol/L (ref 0–100)
TCO2: 18 mmol/L (ref 0–100)
pCO2 arterial: 46.6 mmHg — ABNORMAL HIGH (ref 35.0–45.0)
pCO2 arterial: 34.3 mmHg — ABNORMAL LOW (ref 35.0–45.0)
pH, Arterial: 7.076 — CL (ref 7.350–7.450)

## 2015-04-01 LAB — PROTIME-INR
INR: 1.57 — ABNORMAL HIGH (ref 0.00–1.49)
PROTHROMBIN TIME: 18.8 s — AB (ref 11.6–15.2)

## 2015-04-01 LAB — URINALYSIS, ROUTINE W REFLEX MICROSCOPIC
GLUCOSE, UA: 500 mg/dL — AB
Ketones, ur: 15 mg/dL — AB
Nitrite: NEGATIVE
PROTEIN: 100 mg/dL — AB
SPECIFIC GRAVITY, URINE: 1.028 (ref 1.005–1.030)
pH: 5 (ref 5.0–8.0)

## 2015-04-01 LAB — CBC
HCT: 24.8 % — ABNORMAL LOW (ref 36.0–46.0)
HCT: 29.4 % — ABNORMAL LOW (ref 36.0–46.0)
HCT: 29.6 % — ABNORMAL LOW (ref 36.0–46.0)
HCT: 29.8 % — ABNORMAL LOW (ref 36.0–46.0)
HCT: 30.6 % — ABNORMAL LOW (ref 36.0–46.0)
HEMATOCRIT: 30.6 % — AB (ref 36.0–46.0)
HEMOGLOBIN: 7.8 g/dL — AB (ref 12.0–15.0)
HEMOGLOBIN: 9.3 g/dL — AB (ref 12.0–15.0)
HEMOGLOBIN: 9.9 g/dL — AB (ref 12.0–15.0)
Hemoglobin: 9.2 g/dL — ABNORMAL LOW (ref 12.0–15.0)
Hemoglobin: 9.6 g/dL — ABNORMAL LOW (ref 12.0–15.0)
Hemoglobin: 9.7 g/dL — ABNORMAL LOW (ref 12.0–15.0)
MCH: 27.5 pg (ref 26.0–34.0)
MCH: 27.3 pg (ref 26.0–34.0)
MCH: 27 pg (ref 26.0–34.0)
MCH: 27.3 pg (ref 26.0–34.0)
MCH: 26.7 pg (ref 26.0–34.0)
MCH: 26.9 pg (ref 26.0–34.0)
MCHC: 31.5 g/dL (ref 30.0–36.0)
MCHC: 31.3 g/dL (ref 30.0–36.0)
MCHC: 31.4 g/dL (ref 30.0–36.0)
MCHC: 32.2 g/dL (ref 30.0–36.0)
MCHC: 31.7 g/dL (ref 30.0–36.0)
MCHC: 32.4 g/dL (ref 30.0–36.0)
MCV: 87.3 fL (ref 78.0–100.0)
MCV: 87.2 fL (ref 78.0–100.0)
MCV: 86 fL (ref 78.0–100.0)
MCV: 84.7 fL (ref 78.0–100.0)
MCV: 84.3 fL (ref 78.0–100.0)
MCV: 83.2 fL (ref 78.0–100.0)
Platelets: 240 10*3/uL (ref 150–400)
Platelets: 280 10*3/uL (ref 150–400)
Platelets: 260 10*3/uL (ref 150–400)
Platelets: 254 10*3/uL (ref 150–400)
Platelets: 255 10*3/uL (ref 150–400)
Platelets: 258 10*3/uL (ref 150–400)
RBC: 2.84 MIL/uL — AB (ref 3.87–5.11)
RBC: 3.37 MIL/uL — ABNORMAL LOW (ref 3.87–5.11)
RBC: 3.44 MIL/uL — AB (ref 3.87–5.11)
RBC: 3.52 MIL/uL — ABNORMAL LOW (ref 3.87–5.11)
RBC: 3.63 MIL/uL — ABNORMAL LOW (ref 3.87–5.11)
RBC: 3.68 MIL/uL — ABNORMAL LOW (ref 3.87–5.11)
RDW: 13.7 % (ref 11.5–15.5)
RDW: 13.8 % (ref 11.5–15.5)
RDW: 13.7 % (ref 11.5–15.5)
RDW: 13.6 % (ref 11.5–15.5)
RDW: 13.4 % (ref 11.5–15.5)
RDW: 13.3 % (ref 11.5–15.5)
WBC: 13.5 10*3/uL — AB (ref 4.0–10.5)
WBC: 12.5 10*3/uL — ABNORMAL HIGH (ref 4.0–10.5)
WBC: 12 10*3/uL — AB (ref 4.0–10.5)
WBC: 14.1 10*3/uL — ABNORMAL HIGH (ref 4.0–10.5)
WBC: 18.1 10*3/uL — ABNORMAL HIGH (ref 4.0–10.5)
WBC: 21.1 10*3/uL — AB (ref 4.0–10.5)

## 2015-04-01 LAB — LIPID PANEL
CHOL/HDL RATIO: 2.5 ratio
Cholesterol: 67 mg/dL (ref 0–200)
HDL: 27 mg/dL — AB (ref >40)
LDL Cholesterol: 34 mg/dL (ref 0–99)
Triglycerides: 31 mg/dL (ref <150)
VLDL: 6 mg/dL (ref 0–40)

## 2015-04-01 LAB — GLUCOSE, CAPILLARY
GLUCOSE-CAPILLARY: 118 mg/dL — AB (ref 65–99)
GLUCOSE-CAPILLARY: 171 mg/dL — AB (ref 65–99)
GLUCOSE-CAPILLARY: 263 mg/dL — AB (ref 65–99)
GLUCOSE-CAPILLARY: 379 mg/dL — AB (ref 65–99)
GLUCOSE-CAPILLARY: 382 mg/dL — AB (ref 65–99)
GLUCOSE-CAPILLARY: 517 mg/dL — AB (ref 65–99)
GLUCOSE-CAPILLARY: 520 mg/dL — AB (ref 65–99)
GLUCOSE-CAPILLARY: 561 mg/dL — AB (ref 65–99)
GLUCOSE-CAPILLARY: 89 mg/dL (ref 65–99)
Glucose-Capillary: 333 mg/dL — ABNORMAL HIGH (ref 65–99)
Glucose-Capillary: 392 mg/dL — ABNORMAL HIGH (ref 65–99)
Glucose-Capillary: 577 mg/dL (ref 65–99)
Glucose-Capillary: 562 mg/dL (ref 65–99)
Glucose-Capillary: 553 mg/dL (ref 65–99)
Glucose-Capillary: 536 mg/dL — ABNORMAL HIGH (ref 65–99)
Glucose-Capillary: 423 mg/dL — ABNORMAL HIGH (ref 65–99)
Glucose-Capillary: 427 mg/dL — ABNORMAL HIGH (ref 65–99)
Glucose-Capillary: 351 mg/dL — ABNORMAL HIGH (ref 65–99)
Glucose-Capillary: 331 mg/dL — ABNORMAL HIGH (ref 65–99)
Glucose-Capillary: 292 mg/dL — ABNORMAL HIGH (ref 65–99)
Glucose-Capillary: 232 mg/dL — ABNORMAL HIGH (ref 65–99)

## 2015-04-01 LAB — STREP PNEUMONIAE URINARY ANTIGEN: STREP PNEUMO URINARY ANTIGEN: NEGATIVE

## 2015-04-01 LAB — BASIC METABOLIC PANEL
ANION GAP: 16 — AB (ref 5–15)
ANION GAP: 13 (ref 5–15)
ANION GAP: 17 — AB (ref 5–15)
ANION GAP: 13 (ref 5–15)
BUN: 23 mg/dL — ABNORMAL HIGH (ref 6–20)
BUN: 24 mg/dL — AB (ref 6–20)
BUN: 24 mg/dL — AB (ref 6–20)
BUN: 24 mg/dL — ABNORMAL HIGH (ref 6–20)
CALCIUM: 8.2 mg/dL — AB (ref 8.9–10.3)
CALCIUM: 8 mg/dL — AB (ref 8.9–10.3)
CHLORIDE: 98 mmol/L — AB (ref 101–111)
CHLORIDE: 97 mmol/L — AB (ref 101–111)
CHLORIDE: 94 mmol/L — AB (ref 101–111)
CO2: 14 mmol/L — ABNORMAL LOW (ref 22–32)
CO2: 18 mmol/L — ABNORMAL LOW (ref 22–32)
CO2: 19 mmol/L — ABNORMAL LOW (ref 22–32)
CO2: 23 mmol/L (ref 22–32)
CREATININE: 2.09 mg/dL — AB (ref 0.44–1.00)
Calcium: 8 mg/dL — ABNORMAL LOW (ref 8.9–10.3)
Calcium: 7.8 mg/dL — ABNORMAL LOW (ref 8.9–10.3)
Chloride: 104 mmol/L (ref 101–111)
Creatinine, Ser: 1.74 mg/dL — ABNORMAL HIGH (ref 0.44–1.00)
Creatinine, Ser: 2.08 mg/dL — ABNORMAL HIGH (ref 0.44–1.00)
Creatinine, Ser: 2.1 mg/dL — ABNORMAL HIGH (ref 0.44–1.00)
GFR calc non Af Amer: 20 mL/min — ABNORMAL LOW (ref >60)
GFR, EST AFRICAN AMERICAN: 23 mL/min — AB (ref >60)
GFR, EST AFRICAN AMERICAN: 23 mL/min — AB (ref >60)
GFR, EST AFRICAN AMERICAN: 23 mL/min — AB (ref >60)
GFR, EST AFRICAN AMERICAN: 29 mL/min — AB (ref >60)
GFR, EST NON AFRICAN AMERICAN: 20 mL/min — AB (ref >60)
GFR, EST NON AFRICAN AMERICAN: 20 mL/min — AB (ref >60)
GFR, EST NON AFRICAN AMERICAN: 25 mL/min — AB (ref >60)
Glucose, Bld: 171 mg/dL — ABNORMAL HIGH (ref 65–99)
Glucose, Bld: 426 mg/dL — ABNORMAL HIGH (ref 65–99)
Glucose, Bld: 504 mg/dL — ABNORMAL HIGH (ref 65–99)
Glucose, Bld: 603 mg/dL (ref 65–99)
POTASSIUM: 3.6 mmol/L (ref 3.5–5.1)
POTASSIUM: 3.9 mmol/L (ref 3.5–5.1)
POTASSIUM: 5 mmol/L (ref 3.5–5.1)
Potassium: 2.7 mmol/L — CL (ref 3.5–5.1)
SODIUM: 133 mmol/L — AB (ref 135–145)
SODIUM: 130 mmol/L — AB (ref 135–145)
SODIUM: 132 mmol/L — AB (ref 135–145)
SODIUM: 131 mmol/L — AB (ref 135–145)

## 2015-04-01 LAB — POCT I-STAT 3, ART BLOOD GAS (G3+)
ACID-BASE DEFICIT: 6 mmol/L — AB (ref 0.0–2.0)
ACID-BASE DEFICIT: 9 mmol/L — AB (ref 0.0–2.0)
Acid-base deficit: 10 mmol/L — ABNORMAL HIGH (ref 0.0–2.0)
BICARBONATE: 19.1 meq/L — AB (ref 20.0–24.0)
Bicarbonate: 20.2 mEq/L (ref 20.0–24.0)
Bicarbonate: 15.6 mEq/L — ABNORMAL LOW (ref 20.0–24.0)
O2 SAT: 98 %
O2 Saturation: 100 %
O2 Saturation: 92 %
PCO2 ART: 49.7 mmHg — AB (ref 35.0–45.0)
PH ART: 7.333 — AB (ref 7.350–7.450)
PO2 ART: 120 mmHg — AB (ref 80.0–100.0)
PO2 ART: 309 mmHg — AB (ref 80.0–100.0)
Patient temperature: 34.5
TCO2: 17 mmol/L (ref 0–100)
TCO2: 21 mmol/L (ref 0–100)
TCO2: 21 mmol/L (ref 0–100)
pCO2 arterial: 36.9 mmHg (ref 35.0–45.0)
pCO2 arterial: 28.2 mmHg — ABNORMAL LOW (ref 35.0–45.0)
pH, Arterial: 7.194 — CL (ref 7.350–7.450)
pH, Arterial: 7.338 — ABNORMAL LOW (ref 7.350–7.450)
pO2, Arterial: 59 mmHg — ABNORMAL LOW (ref 80.0–100.0)

## 2015-04-01 LAB — BRAIN NATRIURETIC PEPTIDE: B NATRIURETIC PEPTIDE 5: 486 pg/mL — AB (ref 0.0–100.0)

## 2015-04-01 LAB — MAGNESIUM
Magnesium: 1.7 mg/dL (ref 1.7–2.4)
Magnesium: 1.4 mg/dL — ABNORMAL LOW (ref 1.7–2.4)

## 2015-04-01 LAB — COMPREHENSIVE METABOLIC PANEL
ALK PHOS: 37 U/L — AB (ref 38–126)
ALT: 78 U/L — AB (ref 14–54)
ALT: 119 U/L — ABNORMAL HIGH (ref 14–54)
AST: 109 U/L — AB (ref 15–41)
AST: 163 U/L — ABNORMAL HIGH (ref 15–41)
Albumin: 2.4 g/dL — ABNORMAL LOW (ref 3.5–5.0)
Albumin: 2.4 g/dL — ABNORMAL LOW (ref 3.5–5.0)
Alkaline Phosphatase: 37 U/L — ABNORMAL LOW (ref 38–126)
Anion gap: 17 — ABNORMAL HIGH (ref 5–15)
Anion gap: 18 — ABNORMAL HIGH (ref 5–15)
BUN: 22 mg/dL — AB (ref 6–20)
BUN: 21 mg/dL — ABNORMAL HIGH (ref 6–20)
CHLORIDE: 106 mmol/L (ref 101–111)
CO2: 18 mmol/L — AB (ref 22–32)
CO2: 18 mmol/L — ABNORMAL LOW (ref 22–32)
Calcium: 8.9 mg/dL (ref 8.9–10.3)
Calcium: 8.3 mg/dL — ABNORMAL LOW (ref 8.9–10.3)
Chloride: 102 mmol/L (ref 101–111)
Creatinine, Ser: 2.02 mg/dL — ABNORMAL HIGH (ref 0.44–1.00)
Creatinine, Ser: 1.9 mg/dL — ABNORMAL HIGH (ref 0.44–1.00)
GFR calc Af Amer: 26 mL/min — ABNORMAL LOW (ref >60)
GFR calc non Af Amer: 22 mL/min — ABNORMAL LOW (ref >60)
GFR, EST AFRICAN AMERICAN: 24 mL/min — AB (ref >60)
GFR, EST NON AFRICAN AMERICAN: 21 mL/min — AB (ref >60)
Glucose, Bld: 422 mg/dL — ABNORMAL HIGH (ref 65–99)
Glucose, Bld: 531 mg/dL — ABNORMAL HIGH (ref 65–99)
Potassium: 4.1 mmol/L (ref 3.5–5.1)
Potassium: 3.6 mmol/L (ref 3.5–5.1)
Sodium: 141 mmol/L (ref 135–145)
Sodium: 138 mmol/L (ref 135–145)
Total Bilirubin: 1.3 mg/dL — ABNORMAL HIGH (ref 0.3–1.2)
Total Bilirubin: 1.6 mg/dL — ABNORMAL HIGH (ref 0.3–1.2)
Total Protein: 4.3 g/dL — ABNORMAL LOW (ref 6.5–8.1)
Total Protein: 4.2 g/dL — ABNORMAL LOW (ref 6.5–8.1)

## 2015-04-01 LAB — INFLUENZA PANEL BY PCR (TYPE A & B)
H1N1 flu by pcr: NOT DETECTED
Influenza A By PCR: NEGATIVE
Influenza B By PCR: NEGATIVE

## 2015-04-01 LAB — POCT I-STAT, CHEM 8
BUN: 24 mg/dL — ABNORMAL HIGH (ref 6–20)
CALCIUM ION: 1.39 mmol/L — AB (ref 1.13–1.30)
CHLORIDE: 109 mmol/L (ref 101–111)
Creatinine, Ser: 1.6 mg/dL — ABNORMAL HIGH (ref 0.44–1.00)
GLUCOSE: 352 mg/dL — AB (ref 65–99)
HCT: 28 % — ABNORMAL LOW (ref 36.0–46.0)
HEMOGLOBIN: 9.5 g/dL — AB (ref 12.0–15.0)
Potassium: 4.8 mmol/L (ref 3.5–5.1)
SODIUM: 140 mmol/L (ref 135–145)
TCO2: 18 mmol/L (ref 0–100)

## 2015-04-01 LAB — POCT I-STAT 3, VENOUS BLOOD GAS (G3P V)
Acid-base deficit: 14 mmol/L — ABNORMAL HIGH (ref 0.0–2.0)
Bicarbonate: 15.5 mEq/L — ABNORMAL LOW (ref 20.0–24.0)
O2 SAT: 92 %
PCO2 VEN: 45 mmHg (ref 45.0–50.0)
TCO2: 17 mmol/L (ref 0–100)
pH, Ven: 7.129 — CL (ref 7.250–7.300)
pO2, Ven: 72 mmHg — ABNORMAL HIGH (ref 30.0–45.0)

## 2015-04-01 LAB — HEPARIN LEVEL (UNFRACTIONATED)
HEPARIN UNFRACTIONATED: 0.24 [IU]/mL — AB (ref 0.30–0.70)
HEPARIN UNFRACTIONATED: 0.29 [IU]/mL — AB (ref 0.30–0.70)

## 2015-04-01 LAB — TROPONIN I
TROPONIN I: 0.1 ng/mL — AB (ref <0.031)
TROPONIN I: 0.19 ng/mL — AB (ref <0.031)
Troponin I: 0.32 ng/mL — ABNORMAL HIGH (ref <0.031)

## 2015-04-01 LAB — BLOOD PRODUCT ORDER (VERBAL) VERIFICATION

## 2015-04-01 LAB — APTT

## 2015-04-01 LAB — HEPATIC FUNCTION PANEL
ALBUMIN: 2.5 g/dL — AB (ref 3.5–5.0)
ALK PHOS: 51 U/L (ref 38–126)
ALT: 294 U/L — ABNORMAL HIGH (ref 14–54)
AST: 387 U/L — AB (ref 15–41)
BILIRUBIN INDIRECT: 0.5 mg/dL (ref 0.3–0.9)
Bilirubin, Direct: 0.5 mg/dL (ref 0.1–0.5)
TOTAL PROTEIN: 4.5 g/dL — AB (ref 6.5–8.1)
Total Bilirubin: 1 mg/dL (ref 0.3–1.2)

## 2015-04-01 LAB — URINE MICROSCOPIC-ADD ON

## 2015-04-01 LAB — MRSA PCR SCREENING: MRSA by PCR: NEGATIVE

## 2015-04-01 LAB — TSH: TSH: 2.726 u[IU]/mL (ref 0.350–4.500)

## 2015-04-01 LAB — PHOSPHORUS
PHOSPHORUS: 5.8 mg/dL — AB (ref 2.5–4.6)
Phosphorus: 7.1 mg/dL — ABNORMAL HIGH (ref 2.5–4.6)

## 2015-04-01 LAB — LACTIC ACID, PLASMA
LACTIC ACID, VENOUS: 10.8 mmol/L — AB (ref 0.5–2.0)
LACTIC ACID, VENOUS: 10.7 mmol/L — AB (ref 0.5–2.0)
Lactic Acid, Venous: 9.3 mmol/L (ref 0.5–2.0)
Lactic Acid, Venous: 8.7 mmol/L (ref 0.5–2.0)

## 2015-04-01 LAB — SODIUM, URINE, RANDOM: Sodium, Ur: 38 mmol/L

## 2015-04-01 LAB — T4, FREE: FREE T4: 1.08 ng/dL (ref 0.61–1.12)

## 2015-04-01 LAB — CREATININE, URINE, RANDOM: Creatinine, Urine: 123.63 mg/dL

## 2015-04-01 MED ORDER — SODIUM CHLORIDE 0.9 % IV SOLN: INTRAVENOUS | Status: DC

## 2015-04-01 MED ORDER — SODIUM CHLORIDE 0.9 % IV BOLUS (SEPSIS)
1000.0000 mL | Freq: Once | INTRAVENOUS | Status: AC
  Administered 2015-04-01: 1000 mL via INTRAVENOUS

## 2015-04-01 MED ORDER — SODIUM CHLORIDE 0.9 % IV SOLN
INTRAVENOUS | Status: DC
  Administered 2015-04-01: 15:00:00 via INTRAVENOUS
  Administered 2015-04-01: 3.3 [IU]/h via INTRAVENOUS
  Filled 2015-04-01 (×2): qty 2.5

## 2015-04-01 MED ORDER — SODIUM BICARBONATE 4.2 % IV SOLN
INTRAVENOUS | Status: DC | PRN
  Administered 2015-04-01 (×2): 50 meq via INTRAVENOUS

## 2015-04-01 MED ORDER — FENTANYL CITRATE (PF) 100 MCG/2ML IJ SOLN
25.0000 ug | INTRAMUSCULAR | Status: DC | PRN
  Administered 2015-04-01 – 2015-04-02 (×8): 50 ug via INTRAVENOUS
  Filled 2015-04-01 (×7): qty 2

## 2015-04-01 MED ORDER — LABETALOL HCL 5 MG/ML IV SOLN: 10.0000 mg | INTRAVENOUS | Status: DC | PRN

## 2015-04-01 MED ORDER — INSULIN ASPART 100 UNIT/ML ~~LOC~~ SOLN
8.0000 [IU] | Freq: Once | SUBCUTANEOUS | Status: AC
  Administered 2015-04-01: 8 [IU] via SUBCUTANEOUS

## 2015-04-01 MED ORDER — LIDOCAINE HCL (CARDIAC) 20 MG/ML IV SOLN
INTRAVENOUS | Status: AC
  Filled 2015-04-01: qty 5

## 2015-04-01 MED ORDER — DEXTROSE 5 % IV SOLN
0.0000 ug/min | INTRAVENOUS | Status: DC
  Administered 2015-04-01: 72 ug/min via INTRAVENOUS
  Administered 2015-04-01: 70 ug/min via INTRAVENOUS
  Administered 2015-04-01: 58 ug/min via INTRAVENOUS
  Administered 2015-04-01: 40 ug/min via INTRAVENOUS
  Administered 2015-04-01: 64 ug/min via INTRAVENOUS
  Filled 2015-04-01 (×6): qty 16

## 2015-04-01 MED ORDER — HYDRALAZINE HCL 20 MG/ML IJ SOLN: 5.0000 mg | INTRAMUSCULAR | Status: DC | PRN

## 2015-04-01 MED ORDER — INSULIN GLARGINE 100 UNIT/ML ~~LOC~~ SOLN
3.0000 [IU] | Freq: Every day | SUBCUTANEOUS | Status: DC
  Filled 2015-04-01: qty 0.03

## 2015-04-01 MED ORDER — PHENYLEPHRINE HCL 10 MG/ML IJ SOLN
30.0000 ug/min | INTRAVENOUS | Status: DC
  Administered 2015-04-01 (×3): 400 ug/min via INTRAVENOUS
  Administered 2015-04-01: 340 ug/min via INTRAVENOUS
  Administered 2015-04-01 (×2): 400 ug/min via INTRAVENOUS
  Administered 2015-04-01: 300 ug/min via INTRAVENOUS
  Administered 2015-04-01: 260 ug/min via INTRAVENOUS
  Filled 2015-04-01 (×10): qty 4

## 2015-04-01 MED ORDER — GUAIFENESIN-DM 100-10 MG/5ML PO SYRP
15.0000 mL | ORAL_SOLUTION | ORAL | Status: DC | PRN
  Filled 2015-04-01: qty 15

## 2015-04-01 MED ORDER — ALUM & MAG HYDROXIDE-SIMETH 200-200-20 MG/5ML PO SUSP
15.0000 mL | ORAL | Status: DC | PRN
  Filled 2015-04-01: qty 30

## 2015-04-01 MED ORDER — HEPARIN (PORCINE) IN NACL 100-0.45 UNIT/ML-% IJ SOLN
800.0000 [IU]/h | INTRAMUSCULAR | Status: DC
  Administered 2015-04-01: 650 [IU]/h via INTRAVENOUS
  Filled 2015-04-01 (×2): qty 250

## 2015-04-01 MED ORDER — FAMOTIDINE IN NACL 20-0.9 MG/50ML-% IV SOLN
20.0000 mg | Freq: Two times a day (BID) | INTRAVENOUS | Status: DC
  Filled 2015-04-01: qty 50

## 2015-04-01 MED ORDER — CHLORHEXIDINE GLUCONATE 0.12% ORAL RINSE (MEDLINE KIT)
15.0000 mL | Freq: Two times a day (BID) | OROMUCOSAL | Status: DC
  Administered 2015-04-01 (×2): 15 mL via OROMUCOSAL

## 2015-04-01 MED ORDER — PROMETHAZINE HCL 25 MG/ML IJ SOLN: 6.2500 mg | INTRAMUSCULAR | Status: DC | PRN

## 2015-04-01 MED ORDER — DEXTROSE 5 % IV SOLN
500.0000 mg | INTRAVENOUS | Status: DC
  Administered 2015-04-01 – 2015-04-02 (×2): 500 mg via INTRAVENOUS
  Filled 2015-04-01 (×2): qty 500

## 2015-04-01 MED ORDER — POTASSIUM CHLORIDE CRYS ER 20 MEQ PO TBCR: 20.0000 meq | EXTENDED_RELEASE_TABLET | Freq: Once | ORAL | Status: DC

## 2015-04-01 MED ORDER — AMIODARONE HCL IN DEXTROSE 360-4.14 MG/200ML-% IV SOLN
60.0000 mg/h | INTRAVENOUS | Status: AC
  Administered 2015-04-01: 60 mg/h via INTRAVENOUS

## 2015-04-01 MED ORDER — AMIODARONE LOAD VIA INFUSION
150.0000 mg | Freq: Once | INTRAVENOUS | Status: AC
  Administered 2015-04-01: 150 mg via INTRAVENOUS
  Filled 2015-04-01: qty 83.34

## 2015-04-01 MED ORDER — HYDROMORPHONE HCL 1 MG/ML IJ SOLN: 0.2500 mg | INTRAMUSCULAR | Status: DC | PRN

## 2015-04-01 MED ORDER — PANTOPRAZOLE SODIUM 40 MG IV SOLR: 40.0000 mg | INTRAVENOUS | Status: DC

## 2015-04-01 MED ORDER — INSULIN ASPART 100 UNIT/ML ~~LOC~~ SOLN
SUBCUTANEOUS | Status: AC
  Filled 2015-04-01: qty 8

## 2015-04-01 MED ORDER — MAGNESIUM SULFATE 2 GM/50ML IV SOLN
2.0000 g | Freq: Once | INTRAVENOUS | Status: AC
  Administered 2015-04-01: 2 g via INTRAVENOUS
  Filled 2015-04-01: qty 50

## 2015-04-01 MED ORDER — SODIUM CHLORIDE 0.9 % IV SOLN: 80.0000 mg | INTRAVENOUS | Status: DC

## 2015-04-01 MED ORDER — AMIODARONE HCL IN DEXTROSE 360-4.14 MG/200ML-% IV SOLN
INTRAVENOUS | Status: AC
  Administered 2015-04-01: 200 mL
  Filled 2015-04-01: qty 400

## 2015-04-01 MED ORDER — ONDANSETRON HCL 4 MG/2ML IJ SOLN: 4.0000 mg | Freq: Four times a day (QID) | INTRAMUSCULAR | Status: DC | PRN

## 2015-04-01 MED ORDER — PANTOPRAZOLE SODIUM 40 MG IV SOLR
80.0000 mg | Freq: Once | INTRAVENOUS | Status: AC
  Administered 2015-04-01: 80 mg via INTRAVENOUS
  Filled 2015-04-01: qty 80

## 2015-04-01 MED ORDER — POTASSIUM CHLORIDE 10 MEQ/50ML IV SOLN
10.0000 meq | INTRAVENOUS | Status: AC
  Administered 2015-04-01 (×3): 10 meq via INTRAVENOUS
  Filled 2015-04-01 (×3): qty 50

## 2015-04-01 MED ORDER — PANTOPRAZOLE SODIUM 40 MG PO TBEC: 40.0000 mg | DELAYED_RELEASE_TABLET | Freq: Every day | ORAL | Status: DC

## 2015-04-01 MED ORDER — METOPROLOL TARTRATE 1 MG/ML IV SOLN: 2.0000 mg | INTRAVENOUS | Status: DC | PRN

## 2015-04-01 MED ORDER — ROCURONIUM BROMIDE 50 MG/5ML IV SOLN
INTRAVENOUS | Status: AC
  Filled 2015-04-01: qty 1

## 2015-04-01 MED ORDER — NALOXONE HCL 0.4 MG/ML IJ SOLN
INTRAMUSCULAR | Status: AC
  Administered 2015-04-01: 0.4 mg
  Filled 2015-04-01: qty 1

## 2015-04-01 MED ORDER — PHENYLEPHRINE HCL 10 MG/ML IJ SOLN
30.0000 ug/min | INTRAVENOUS | Status: DC
  Administered 2015-04-01: 50 ug/min via INTRAVENOUS
  Administered 2015-04-01 (×2): 300 ug/min via INTRAVENOUS
  Filled 2015-04-01 (×3): qty 1

## 2015-04-01 MED ORDER — MEPERIDINE HCL 25 MG/ML IJ SOLN: 6.2500 mg | INTRAMUSCULAR | Status: DC | PRN

## 2015-04-01 MED ORDER — VANCOMYCIN HCL IN DEXTROSE 750-5 MG/150ML-% IV SOLN
750.0000 mg | INTRAVENOUS | Status: DC
  Administered 2015-04-01: 750 mg via INTRAVENOUS
  Filled 2015-04-01: qty 150

## 2015-04-01 MED ORDER — OXYCODONE-ACETAMINOPHEN 5-325 MG PO TABS: 1.0000 | ORAL_TABLET | ORAL | Status: DC | PRN

## 2015-04-01 MED ORDER — FENTANYL CITRATE (PF) 100 MCG/2ML IJ SOLN: 100.0000 ug | Freq: Once | INTRAMUSCULAR | Status: DC

## 2015-04-01 MED ORDER — STERILE WATER FOR INJECTION IV SOLN
INTRAVENOUS | Status: DC
  Administered 2015-04-01 (×3): via INTRAVENOUS
  Filled 2015-04-01 (×7): qty 850

## 2015-04-01 MED ORDER — MIDAZOLAM HCL 2 MG/2ML IJ SOLN
1.0000 mg | INTRAMUSCULAR | Status: DC | PRN
  Administered 2015-04-01 – 2015-04-02 (×4): 1 mg via INTRAVENOUS
  Filled 2015-04-01 (×3): qty 2

## 2015-04-01 MED ORDER — NOREPINEPHRINE BITARTRATE 1 MG/ML IV SOLN
0.0000 ug/min | Freq: Once | INTRAVENOUS | Status: AC
  Administered 2015-04-01: 40 ug/min via INTRAVENOUS
  Filled 2015-04-01: qty 4

## 2015-04-01 MED ORDER — PANTOPRAZOLE SODIUM 40 MG IV SOLR
40.0000 mg | Freq: Two times a day (BID) | INTRAVENOUS | Status: DC
  Administered 2015-04-01: 40 mg via INTRAVENOUS
  Filled 2015-04-01 (×3): qty 40

## 2015-04-01 MED ORDER — SODIUM BICARBONATE 8.4 % IV SOLN
INTRAVENOUS | Status: AC
  Filled 2015-04-01: qty 50

## 2015-04-01 MED ORDER — AMIODARONE HCL IN DEXTROSE 360-4.14 MG/200ML-% IV SOLN
30.0000 mg/h | INTRAVENOUS | Status: DC
  Administered 2015-04-01 (×2): 30 mg/h via INTRAVENOUS
  Filled 2015-04-01 (×5): qty 200

## 2015-04-01 MED ORDER — NEOSTIGMINE METHYLSULFATE 10 MG/10ML IV SOLN
INTRAVENOUS | Status: AC
  Filled 2015-04-01: qty 1

## 2015-04-01 MED ORDER — SUCCINYLCHOLINE CHLORIDE 20 MG/ML IJ SOLN
INTRAMUSCULAR | Status: AC
  Filled 2015-04-01: qty 1

## 2015-04-01 MED ORDER — DEXTROSE 50 % IV SOLN: 15.0000 mL | Freq: Once | INTRAVENOUS | Status: AC

## 2015-04-01 MED ORDER — MIDAZOLAM HCL 2 MG/2ML IJ SOLN
1.0000 mg | INTRAMUSCULAR | Status: DC | PRN
  Filled 2015-04-01: qty 2

## 2015-04-01 MED ORDER — SODIUM BICARBONATE 8.4 % IV SOLN
INTRAVENOUS | Status: AC
  Administered 2015-04-01: 50 meq via INTRAVENOUS
  Filled 2015-04-01: qty 50

## 2015-04-01 MED ORDER — ASPIRIN EC 81 MG PO TBEC
81.0000 mg | DELAYED_RELEASE_TABLET | Freq: Every day | ORAL | Status: DC
  Filled 2015-04-01: qty 1

## 2015-04-01 MED ORDER — SODIUM CHLORIDE 0.9 % IV SOLN
8.0000 mg/h | INTRAVENOUS | Status: DC
  Administered 2015-04-01: 8 mg/h via INTRAVENOUS
  Filled 2015-04-01 (×2): qty 80

## 2015-04-01 MED ORDER — DEXTROSE 5 % IV SOLN
1.0000 g | INTRAVENOUS | Status: DC
  Administered 2015-04-01 – 2015-04-02 (×2): 1 g via INTRAVENOUS
  Filled 2015-04-01 (×2): qty 1

## 2015-04-01 MED ORDER — VASOPRESSIN 20 UNIT/ML IV SOLN
0.0300 [IU]/min | INTRAVENOUS | Status: DC
  Administered 2015-04-01 – 2015-04-02 (×2): 0.03 [IU]/min via INTRAVENOUS
  Filled 2015-04-01 (×2): qty 2

## 2015-04-01 MED ORDER — DEXTROSE 50 % IV SOLN
INTRAVENOUS | Status: AC
  Administered 2015-04-01: 15 mL
  Filled 2015-04-01: qty 50

## 2015-04-01 MED ORDER — FENTANYL CITRATE (PF) 100 MCG/2ML IJ SOLN
INTRAMUSCULAR | Status: AC
  Filled 2015-04-01: qty 2

## 2015-04-01 MED ORDER — SODIUM BICARBONATE 8.4 % IV SOLN
50.0000 meq | Freq: Once | INTRAVENOUS | Status: AC
  Administered 2015-04-01: 50 meq via INTRAVENOUS

## 2015-04-01 MED ORDER — PHENOL 1.4 % MT LIQD
1.0000 | OROMUCOSAL | Status: DC | PRN
  Filled 2015-04-01: qty 177

## 2015-04-01 MED ORDER — HYDROMORPHONE HCL 1 MG/ML IJ SOLN: 0.5000 mg | INTRAMUSCULAR | Status: DC | PRN

## 2015-04-01 MED ORDER — HYDROCORTISONE NA SUCCINATE PF 100 MG IJ SOLR
50.0000 mg | Freq: Four times a day (QID) | INTRAMUSCULAR | Status: DC
  Administered 2015-04-01 (×3): 50 mg via INTRAVENOUS
  Filled 2015-04-01 (×2): qty 1
  Filled 2015-04-01 (×2): qty 2
  Filled 2015-04-01: qty 1
  Filled 2015-04-01: qty 2
  Filled 2015-04-01: qty 1

## 2015-04-01 MED ORDER — DEXTROSE-NACL 5-0.9 % IV SOLN: INTRAVENOUS | Status: DC

## 2015-04-01 MED ORDER — POTASSIUM CHLORIDE 2 MEQ/ML IV SOLN
INTRAVENOUS | Status: DC
  Administered 2015-04-01: 13:00:00 via INTRAVENOUS
  Filled 2015-04-01 (×3): qty 1000

## 2015-04-01 MED ORDER — ANTISEPTIC ORAL RINSE SOLUTION (CORINZ)
7.0000 mL | Freq: Four times a day (QID) | OROMUCOSAL | Status: DC
  Administered 2015-04-01 – 2015-04-02 (×4): 7 mL via OROMUCOSAL

## 2015-04-01 MED ORDER — ETOMIDATE 2 MG/ML IV SOLN
INTRAVENOUS | Status: AC
  Filled 2015-04-01: qty 10

## 2015-04-01 NOTE — Progress Notes (Signed)
  Amiodarone Drug - Drug Interaction Consult Note  Recommendations: Monitor closely.  Amiodarone is metabolized by the cytochrome P450 system and therefore has the potential to cause many drug interactions. Amiodarone has an average plasma half-life of 50 days (range 20 to 100 days).   There is potential for drug interactions to occur several weeks or months after stopping treatment and the onset of drug interactions may be slow after initiating amiodarone.   []  Statins: Increased risk of myopathy. Simvastatin- restrict dose to 20mg  daily. Other statins: counsel patients to report any muscle pain or weakness immediately.  []  Beta blockers: increased risk of bradycardia, AV block and myocardial depression. Sotalol - avoid concomitant use.  Thank You,  Lavonia Danamend, Keymari Sato George  04/01/2015 3:46 AM

## 2015-04-01 NOTE — Progress Notes (Signed)
eLink Physician-Brief Progress Note Patient Name: Cynthia Montes DOB: 08-17-1923 MRN: [id number]   Date of Service  04/01/2015  HPI/Events of Note  Hypotension - BP = 86/35. Cardizem IV infusion now held.   eICU Interventions  Will order: 1. 0.9 NaCl 1 liter IV over 1 hour now.      Intervention Category Major Interventions: Hypotension - evaluation and management  Sommer,Steven Eugene 04/01/2015, 2:22 AM

## 2015-04-01 NOTE — Procedures (Signed)
Intubation Procedure Note Cynthia Montes [id number] Dec 04, 1923  Procedure: Intubation Indications: Respiratory insufficiency  Procedure Details Consent: Unable to obtain consent because of altered level of consciousness. Time Out: Verified patient identification, verified procedure, site/side was marked, verified correct patient position, special equipment/implants available, medications/allergies/relevent history reviewed, required imaging and test results available.  Performed  Maximum sterile technique was used including antiseptics, cap, gloves, gown, hand hygiene, mask and sheet.  MAC and 3 Glidescope.  Grade 1 Airway No sedation/analgesia required  Evaluation Hemodynamic Status: Persistent hypotension treated with pressors; O2 sats: stable throughout Patient's Current Condition: stable Complications: No apparent complications Patient did tolerate procedure well. Chest X-ray ordered to verify placement.  CXR: tube position acceptable.  Joneen RoachPaul Hoffman, AGACNP-BC [facility] Pulmonology/Critical Care Pager [phone] or [phone]  04/01/2015 4:14 AM

## 2015-04-01 NOTE — Consult Note (Signed)
PULMONARY / CRITICAL CARE MEDICINE   Name: Cynthia Montes MRN: [id number] DOB: [birth year]    ADMISSION DATE:  04/01/2015 CONSULTATION DATE:  04/07/2015  REFERRING MD:  TRH  CHIEF COMPLAINT:  shock  HISTORY OF PRESENT ILLNESS:   81 year old female with PMH as below, which includes DM, PVD, and HTN. [facility] complaining of acute onset L leg pain beginning at approximately 1500. Pain was described as aching and radiating to lower back. Duplex was performed in the ED which felt as though there could be clot in the L femoral artery. She was seen by Vascular in the ED as well, and upon their assessment she was noted to have diminished femoral pulse. Popliteal and pedal pulses were unable to be palpated. Foot was pale and cool. On the R pedal pulses were unpalpable, but the foot appeared adequately perfused. She was also noted to be in AF with RVR which was new onset at that point. Due to this new finding, acute illness thought to be an embolic event. She was initiated on diltiazem infusion and was taken emergently to OR for L femoral embolectomy. Post operatively she was admitted to SDU under the hospitalists. She became increasingly lethargic and hypotensive. PCCM asked to see.   PAST MEDICAL HISTORY :  She  has a past medical history of Diabetes mellitus without complication (HCC); Hypertension; Elevated cholesterol; and Arthritis.  PAST SURGICAL HISTORY: She  has past surgical history that includes Cholecystectomy.  No Known Allergies  No current facility-administered medications on file prior to encounter.   No current outpatient prescriptions on file prior to encounter.    FAMILY HISTORY:  Her has no family status information on file.   SOCIAL HISTORY: She  reports that she has never smoked. She does not have any smokeless tobacco history on file. She reports that she does not drink alcohol.  REVIEW OF SYSTEMS:   Unable > patient is  encephalopathic  SUBJECTIVE:    VITAL SIGNS: BP 110/87 mmHg  Pulse 146  Temp(Src) 93.8 F (34.3 C) (Axillary)  Resp 25  Ht 5\' 8"  (1.727 m)  Wt 54.432 kg (120 lb)  BMI 18.25 kg/m2  SpO2 93%  LMP   HEMODYNAMICS:    VENTILATOR SETTINGS: Vent Mode:  [-] PRVC FiO2 (%):  [100 %] 100 % Set Rate:  [18 bmp] 18 bmp Vt Set:  [420 mL] 420 mL PEEP:  [5 cmH20] 5 cmH20 Plateau Pressure:  [26 cmH20] 26 cmH20  INTAKE / OUTPUT:    PHYSICAL EXAMINATION: General:  Elderly, chronically ill appearing female of normal body habitus. Neuro:  Minimally responsive to tactile stimulation. Not following commands or moving extremities HEENT:  Bono/AT, PERRL, no appreciable JVD Cardiovascular:  Irreg irreg, AF on monitor. no MRG. Tachycardic rate 120s Lungs:  Bilateral rhonchi, poor effort, paradoxical respirations. Pooling secretions in posterior pharynx  Abdomen:  Soft, non-distended Musculoskeletal:  No acute deformity Skin:  New surgical incision to L groin. Drain in place draining minimal serosanguinous secretions   LABS:  BMET  Recent Labs Lab 04/10/2015 2336 04/01/15 0006 04/01/15 0315  NA 131* 135 141  K 5.0 4.9 4.1  CL 104  --  106  CO2 14*  --  18*  BUN 24*  --  22*  CREATININE 1.74*  --  2.02*  GLUCOSE 426*  --  422*    Electrolytes  Recent Labs Lab 03/27/2015 2336 04/01/15 0315  CALCIUM 8.0* 8.9    CBC  Recent Labs Lab 03/18/2015 2121 04/01/15 0006 04/01/15 0315  WBC 20.4*  --  13.5*  HGB 10.8* 8.5* 7.8*  HCT 34.2* 25.0* 24.8*  PLT 279  --  240    Coag's  Recent Labs Lab 04/01/2015 2336 03/21/2015 2337  APTT >200*  --   INR  --  1.57*    Sepsis Markers No results for input(s): LATICACIDVEN, PROCALCITON, O2SATVEN in the last 168 hours.  ABG  Recent Labs Lab 04/01/15 0006 04/01/15 0028 04/01/15 0413  PHART 7.076* 7.194* 7.333*  PCO2ART 46.6* 49.7* 36.9  PO2ART 139.0* 120.0* 309.0*    Liver Enzymes  Recent Labs Lab 04/01/15 0315  AST 109*   ALT PENDING  ALKPHOS 37*  BILITOT 1.3*  ALBUMIN 2.4*    Cardiac Enzymes  Recent Labs Lab 03/24/2015 2336 04/01/15 0315  TROPONINI 0.10* 0.19*    Glucose  Recent Labs Lab 04/01/15 0047 04/01/15 0243  GLUCAP 379* 333*    Imaging Dg Chest Port 1 View  04/01/2015  Duayne Cal, NP     04/01/2015  4:12 AM Central Venous Catheter Insertion Procedure Note Cynthia Montes Cynthia Montes Cynthia Montes Procedure: Insertion of Central Venous Catheter Indications: Assessment of intravascular volume, Drug and/or fluid administration and Frequent blood sampling Procedure Details Consent: Risks of procedure as well as the alternatives and risks of each were explained to the (patient/caregiver).  Consent for procedure obtained. Time Out: Verified patient identification, verified procedure, site/side was marked, verified correct patient position, special equipment/implants available, medications/allergies/relevent history reviewed, required imaging and test results available.  Performed Maximum sterile technique was used including antiseptics, cap, gloves, gown, hand hygiene, mask and sheet. Skin prep: Chlorhexidine; local anesthetic administered A antimicrobial bonded/coated triple lumen catheter was placed in the left internal jugular vein using the Seldinger technique. Ultrasound guidance used.Yes.  Catheter placed to 20 cm. Blood aspirated via all 3 ports and then flushed x 3. Line sutured x 2 and dressing applied. Evaluation Blood flow good Complications: No apparent complications Patient did tolerate procedure well. Chest X-ray ordered to verify placement.  CXR: normal. Joneen Roach, AGACNP-BC [facility] Pulmonology/Critical Care Pager [phone] or [phone] 04/01/2015 4:12 AM   Dg Chest Portable 1 View  03/23/2015  CLINICAL DATA:  Severe left leg pain from hip to knee EXAM: PORTABLE CHEST 1 VIEW COMPARISON:  None. FINDINGS: Normal heart size and mediastinal contours. There is interstitial  coarsening at the bases with subtle Kerley lines seen peripherally. No effusion or air leak. Symmetric biapical pleural scarring. IMPRESSION: Mild bronchitic or congestive interstitial coarsening. Electronically Signed   By: Marnee Spring M.D.   On: 03/25/2015 22:15     STUDIES:  Echo 12/19 >>> RUQ [place] 12/19 >>> KUB 12/19 >>> Renal US 12/19 >>>>  CULTURES: RVP 12/19 >>> BCx2 12/19 >>> Urine Cx 12/19 >>> Tracheal aspirate 12/19 >>> Urine strep 12/19 >>> Urine legionella 12/19 >>>  ANTIBIOTICS: Elita Quick 12/19 >>> Vancomycin 12/19 >>>  SIGNIFICANT EVENTS: 12/18 > acute onset L leg pain, to OR for emergent embolectomy 12/19 > decompensated developing shock requiring pressors, intubation.   LINES/TUBES: LIJ CVL 12/19 > ETT 12/19 > R rad Art line 12/18 >  DISCUSSION: 81 year old female taken emergently to OR for L femoral thrombectomy. Post operatively she was taken to SDU under TRH, but quickly decompensated. She became very hypotensive requiring epinephrine/sodium bicarb/atropine pushes to prevent cardiac arrest. She was intubated and placed on vasoactive infusions. CVL placed. She appears to have stabilized at this time. Cause not entirely  certain at this point, however, sepsis is the leading diagnosis as  productive cough was reported in ER and CXR has appearance of bilateral PNA. Will pan culture and give broad ABX coverage. BP now stabilized on high dose neo, levo, and shock dose vaso. Amio for AF RVR. Doubt that there is a hemorrhagic component post of, no active signs of bleeding. Prognosis is guarded at this point.   ASSESSMENT / PLAN:  PULMONARY A: Acute hypoxemic respiratory failure secondary to bilateral pneumonia  P:   STAT intubation Full ventilatory support Daily SBT/WUA Vent bundle  CARDIOVASCULAR A:  Shock > etiology not entirely clear. Potentially sepsis secondary to PNA, limb/bowel ischemia. Doubt hemorrhagic.  Left femoral artery occlusion s/p  embolectomy. Lactic acidosis H/o HTN, HLD  P:  Telemetry monitoring Vasoactive infusions titrated to MAP goal >  Norepinephrine ( ) Phenylephrine ( ) Vasopressin shock dose Amiodarone bolus and drip Trend lactic acid Trending troponin Repeat EKG Echo pending  RENAL A:   Acute renal failure in the setting of shock  P:   IVF to [facility] Monitor serum creatinine and urine output Place Foley catheter for strict I&O Correct electrolytes as indicated.   GASTROINTESTINAL A:   Transaminitis - possibly shock liver  P:   Trend LFT RUQ [place]  Protonix for SUP prophylaxis NPO  HEMATOLOGIC A:   Anemia - normocytic, normochromic. Consider acute blood loss component postoperatively, however, no evidence of bleeding. Hemodilution likely playing role.   P:  SCDs Hold VTE chemoprophylaxis for now Trend Hemoglobin and hematocrit  Transfuse per ICU guidelines Will need systemic anticoagulation if AF persists, will defer timing to vascular.   INFECTIOUS A:   Septic shock secondary to pneumonia  P:   Cultures and ABX as above  Trend WBC and fever curve  ENDOCRINE A:   Hyperglycemia with history of DM  P:   Insulin gtt per protocol Serial CBG monitoring  NEUROLOGIC A:   Acute metabolic encephalopathy secondary to shock  P:   RASS goal: -1 PRN versed for sedation   FAMILY  - Updates: Family updated by Sharlet Salina family meet or Palliative Care meeting due by:  12/26   APP critical care time independent of procedures: 50 mins  Joneen Roach, AGACNP-BC [facility] Pulmonology/Critical Care Pager [phone] or [phone]  04/01/2015 4:57 AM

## 2015-04-01 NOTE — Procedures (Signed)
Central Venous Catheter Insertion Procedure Note Cynthia PolkaMary F Montes [id number] 01/18/1924  Procedure: Insertion of Central Venous Catheter Indications: Assessment of intravascular volume, Drug and/or fluid administration and Frequent blood sampling  Procedure Details Consent: Risks of procedure as well as the alternatives and risks of each were explained to the (patient/caregiver).  Consent for procedure obtained. Time Out: Verified patient identification, verified procedure, site/side was marked, verified correct patient position, special equipment/implants available, medications/allergies/relevent history reviewed, required imaging and test results available.  Performed  Maximum sterile technique was used including antiseptics, cap, gloves, gown, hand hygiene, mask and sheet. Skin prep: Chlorhexidine; local anesthetic administered A antimicrobial bonded/coated triple lumen catheter was placed in the left internal jugular vein using the Seldinger technique. Ultrasound guidance used.Yes.   Catheter placed to 20 cm. Blood aspirated via all 3 ports and then flushed x 3. Line sutured x 2 and dressing applied.  Evaluation Blood flow good Complications: No apparent complications Patient did tolerate procedure well. Chest X-ray ordered to verify placement.  CXR: normal.  Joneen RoachPaul Umair Rosiles, AGACNP-BC [facility] Pulmonology/Critical Care Pager [phone] or [phone]  04/01/2015 4:12 AM

## 2015-04-01 NOTE — Progress Notes (Signed)
eLink Physician-Brief Progress Note Patient Name: Cynthia Montes DOB: 12/12/1923 MRN: [id number]   Date of Service  04/01/2015  HPI/Events of Note  Multiple issues: 1. Hypotension - BP = 72/43 and dark bloody output from NGT.  eICU Interventions  Will order: 1. Protonix 80 mg IV bolus and IV infusion at 8 mg/hour.  2. CBC Q 4 hours. 3. Increase ceilings on Norepinephrine IV infusion to 70 mcg/min and Phenylephrine IV infusion to 400 mcg. 4. Monitor CVP. 5. Check ABG now.     Intervention Category Major Interventions: Hemorrhage - evaluation and management;Hypotension - evaluation and management  [name] Dennard Nipugene 04/01/2015, 5:58 AM

## 2015-04-01 NOTE — Progress Notes (Addendum)
PULMONARY / CRITICAL CARE MEDICINE   Name: Cynthia PolkaMary F Bartok MRN: [id number] DOB: 06/24/1923    ADMISSION DATE:  07-24-2014 CONSULTATION DATE:  07-24-2014  REFERRING MD:  TRH  CHIEF COMPLAINT:  shock  HISTORY OF PRESENT ILLNESS:   81 year old female with PMH as below, which includes DM, PVD, and HTN. [facility] complaining of acute onset L leg pain beginning at approximately 1500. Pain was described as aching and radiating to lower back. Duplex was performed in the ED which felt as though there could be clot in the L femoral artery. She was seen by Vascular in the ED as well, and upon their assessment she was noted to have diminished femoral pulse. Popliteal and pedal pulses were unable to be palpated. Foot was pale and cool. On the R pedal pulses were unpalpable, but the foot appeared adequately perfused. She was also noted to be in AF with RVR which was new onset at that point. Due to this new finding, acute illness thought to be an embolic event. She was initiated on diltiazem infusion and was taken emergently to OR for L femoral embolectomy. Post operatively she was admitted to SDU under the hospitalists. She became increasingly lethargic and hypotensive. PCCM asked to see.   SUBJECTIVE:  Sedated on vent. Does not follows commands upon WUA. Left foot/leg mottled, no palpable pulse.   VITAL SIGNS: BP 123/73 mmHg  Pulse 44  Temp(Src) 94.5 F (34.7 C) (Axillary)  Resp 24  Ht 5\' 8"  (1.727 m)  Wt 132 lb 15 oz (60.3 kg)  BMI 20.22 kg/m2  SpO2 38%  LMP   HEMODYNAMICS: CVP:  [15 mmHg-16 mmHg] 15 mmHg  VENTILATOR SETTINGS: Vent Mode:  [-] PRVC FiO2 (%):  [60 %-100 %] 60 % Set Rate:  [18 bmp] 18 bmp Vt Set:  [420 mL] 420 mL PEEP:  [5 cmH20] 5 cmH20 Plateau Pressure:  [26 cmH20] 26 cmH20  INTAKE / OUTPUT: I/O last 3 completed shifts: In: 8605.3 [I.V.:5955.3; Other:150; IV Piggyback:2500] Out: 350 [Urine:250; Blood:100]  PHYSICAL EXAMINATION: General:   Elderly woman sedated on vent  Neuro: Sedated, does not follow commands during WUA HEENT:  [place]/AT, PERRL, no appreciable JVD, coffee ground emesis in OG tube CV:  Irregularly irregular, no MRG. Tachycardic rate 130s Pulm:  Bilateral rhonchi Abdomen:  BS hypoactive, soft, non-distended Musculoskeletal:  No acute deformity, no peripheral edema, distal pulses 1+ on left and unable to palpate on right  Skin: Surgical incision to L groin. Drain in place draining minimal serosanguinous secretions. Left foot/lower leg mottled.  LABS:  BMET  Recent Labs Lab 08/02/2014 2336  04/01/15 0259 04/01/15 0315 04/01/15 0441  NA 131*  < > 140 141 138  K 5.0  < > 4.8 4.1 3.6  CL 104  --  109 106 102  CO2 14*  --   --  18* 18*  BUN 24*  --  24* 22* 21*  CREATININE 1.74*  --  1.60* 2.02* 1.90*  GLUCOSE 426*  --  352* 422* 531*  < > = values in this interval not displayed.  Electrolytes  Recent Labs Lab 08/02/2014 2336 04/01/15 0315 04/01/15 0441  CALCIUM 8.0* 8.9 8.3*  MG  --  1.7 1.4*  PHOS  --  7.1* 5.8*    CBC  Recent Labs Lab 08/02/2014 2121  04/01/15 0259 04/01/15 0315 04/01/15 0714  WBC 20.4*  --   --  13.5* 12.5*  HGB 10.8*  < > 9.5*  7.8* 9.2*  HCT 34.2*  < > 28.0* 24.8* 29.4*  PLT 279  --   --  240 280  < > = values in this interval not displayed.  Coag's  Recent Labs Lab 04/03/2015 2336 03/27/2015 2337  APTT >200*  --   INR  --  1.57*    Sepsis Markers  Recent Labs Lab 04/01/15 0315 04/01/15 0520  LATICACIDVEN 10.8* 10.7*    ABG  Recent Labs Lab 04/01/15 0028 04/01/15 0413 04/01/15 0602  PHART 7.194* 7.333* 7.338*  PCO2ART 49.7* 36.9 28.2*  PO2ART 120.0* 309.0* 59.0*    Liver Enzymes  Recent Labs Lab 04/01/15 0315 04/01/15 0441  AST 109* 163*  ALT 78* 119*  ALKPHOS 37* 37*  BILITOT 1.3* 1.6*  ALBUMIN 2.4* 2.4*    Cardiac Enzymes  Recent Labs Lab 03/14/2015 2336 04/01/15 0315  TROPONINI 0.10* 0.19*    Glucose  Recent Labs Lab  04/01/15 0047 04/01/15 0243 04/01/15 0449 04/01/15 0558 04/01/15 0706 04/01/15 0805  GLUCAP 379* 333* 392* 577* 561* 562*    Imaging Dg Chest Port 1 View  04/01/2015  Duayne Cal, NP     04/01/2015  4:12 AM Central Venous Catheter Insertion Procedure Note CALAYAH GUADARRAMA [id number] 1924/03/29 Procedure: Insertion of Central Venous Catheter Indications: Assessment of intravascular volume, Drug and/or fluid administration and Frequent blood sampling Procedure Details Consent: Risks of procedure as well as the alternatives and risks of each were explained to the (patient/caregiver).  Consent for procedure obtained. Time Out: Verified patient identification, verified procedure, site/side was marked, verified correct patient position, special equipment/implants available, medications/allergies/relevent history reviewed, required imaging and test results available.  Performed Maximum sterile technique was used including antiseptics, cap, gloves, gown, hand hygiene, mask and sheet. Skin prep: Chlorhexidine; local anesthetic administered A antimicrobial bonded/coated triple lumen catheter was placed in the left internal jugular vein using the Seldinger technique. Ultrasound guidance used.Yes.  Catheter placed to 20 cm. Blood aspirated via all 3 ports and then flushed x 3. Line sutured x 2 and dressing applied. Evaluation Blood flow good Complications: No apparent complications Patient did tolerate procedure well. Chest X-ray ordered to verify placement.  CXR: normal. Joneen Roach, AGACNP-BC [facility] Pulmonology/Critical Care Pager [phone] or [phone] 04/01/2015 4:12 AM   Dg Chest Portable 1 View  04/09/2015  CLINICAL DATA:  Severe left leg pain from hip to knee EXAM: PORTABLE CHEST 1 VIEW COMPARISON:  None. FINDINGS: Normal heart size and mediastinal contours. There is interstitial coarsening at the bases with subtle Kerley lines seen peripherally. No effusion or air leak. Symmetric biapical  pleural scarring. IMPRESSION: Mild bronchitic or congestive interstitial coarsening. Electronically Signed   By: Marnee Spring M.D.   On: 03/30/2015 22:15   Dg Abd Portable 1v  04/01/2015  CLINICAL DATA:  Clot. EXAM: PORTABLE ABDOMEN - 1 VIEW COMPARISON:  None. FINDINGS: There is a surgical drain at the left groin correlating with the operative note. No vascular or enteric catheter identified. The bowel gas pattern is nonobstructive. Diffuse arterial and vascular calcification. Lumbar dextroscoliosis. IMPRESSION: 1. Left groin drain. 2. Normal bowel gas pattern. Electronically Signed   By: Marnee Spring M.D.   On: 04/01/2015 04:29     STUDIES:  Echo 12/19 >>> RUQ [place] 12/19 >>> KUB 12/19 >>> Renal US 12/19 >>>>  CULTURES: RVP 12/19 >>> BCx2 12/19 >>> Urine Cx 12/19 >>> Tracheal aspirate 12/19 >>> Urine strep 12/19 >>> neg  ANTIBIOTICS: Elita Quick 12/19 >>> Vancomycin 12/19 >>> Azithromycin 12/19>>  SIGNIFICANT EVENTS: 12/18 > acute onset L leg pain, to OR for emergent embolectomy 12/19 > decompensated developing shock requiring pressors, intubation.   LINES/TUBES: LIJ CVL 12/19 > ETT 12/19 > R rad Art line 12/18 >  DISCUSSION: 81 year old female taken emergently to OR for L femoral thrombectomy. Post operatively she was taken to SDU under TRH, but quickly decompensated. She became very hypotensive requiring epinephrine/sodium bicarb/atropine pushes to prevent cardiac arrest. She was intubated and placed on vasoactive infusions. CVL placed. She appears to have stabilized at this time. Cause not entirely certain at this point, however, sepsis is the leading diagnosis as  productive cough was reported in ER and CXR has appearance of bilateral PNA. Will pan culture and give broad ABX coverage. BP now stabilized on high dose neo, levo, and shock dose vaso. Amio for AF RVR. Doubt that there is a hemorrhagic component post of, no active signs of bleeding. Prognosis is guarded at this  point.   ASSESSMENT / PLAN:  PULMONARY OETT 12/19>> A: Acute hypoxemic respiratory failure secondary to bilateral pneumonia (doubt) ARDS? ALI P:   Full ventilatory support SBT hold, high dose pressors ABG reviewed, keep same MV, may need increase pcx rin am  Keep plat less 30   CARDIOVASCULAR A:  Shock > etiology not entirely clear. Potentially sepsis secondary to PNA, limb/bowel ischemia. Doubt hemorrhagic Left femoral artery occlusion s/p embolectomy Lactic acidosis H/o HTN, HLD Atrial Fibrillation  P:  Tele MAP goal >  On Levophed, Vasopressin  Neo such a weak pressor, wean to off, no role , can increase levo to 75 max Continue amio gtt, still tachy, bolus further top max 450 in 24 hr Lactic acid maintaining at 10, continue to trend Trend troponins, mild bump Echo pending Vascular surgery following- monitoring right leg for now as unchanged from previous exam Cortisol then stress roids Heparin drip  RENAL A:   Acute renal failure in the setting of shock Metabolic acidosis/Lactic acidosis Hypomagnesemia Hyperphosphatemia  P:   NS at 75 Oliguric- UOP 250 Monitor serum creatinine and urine output Replace electrolytes k supp conservative  GASTROINTESTINAL A:   Transaminitis - possibly shock liver  P:   Trend LFT RUQ [place] pending  Protonix for SUP prophylaxis NPO  HEMATOLOGIC A:   Anemia - normocytic, normochromic. Consider acute blood loss component postoperatively, however, no evidence of bleeding. Hemodilution likely playing role.  Leukocytosis- likely reactive   P:  Heparin gtt Trend Hemoglobin and hematocrit  Transfuse for Hgb < 7.0 Repeat INR in am   INFECTIOUS A:   Shock MODS, doubt PNA  P:   Cultures and ABX as above  Trend WBC and fever curve  ENDOCRINE A:   Hyperglycemia with history of DM R/o rel AI P:   Insulin gtt per protocol Serial CBG monitoring Stress roids empiric  NEUROLOGIC A:   Acute metabolic  encephalopathy secondary to shock  P:   RASS goal: -1 PRN versed for sedation Avoid benzo  FAMILY  - Updates: Family updated 12/19  - Inter-disciplinary family meet or Palliative Care meeting due by:  12/26   Rich Number, MD, MPH Internal Medicine Resident, PGY-II Pager: [phone]  04/01/2015 8:42 AM    STAFF NOTE: Cindi Carbon, MD FACP have personally reviewed patient's available data, including medical history, events of note, physical examination and test results as part of my evaluation. I have discussed with resident/NP and other care providers such as pharmacist, RN and RRT. In addition, I personally evaluated  patient and elicited key findings of: right foot mottled, left pulse wnl, coarse BS, high pressor needs, MODS, unlikely to survive, wil update family now consider dnr, wean neo off as able, amio bolus, maintain, cortisol then stress roids, echo now, hold feeds, k supp, poor HD candidate, bmet q8h, heparin drip to remain, get pt in am  The patient is critically ill with multiple organ systems failure and requires high complexity decision making for assessment and support, frequent evaluation and titration of therapies, application of advanced monitoring technologies and extensive interpretation of multiple databases.   Critical Care Time devoted to patient care services described in this note is50 Minutes. This time reflects time of care of this signee: Rory Percy, MD FACP. This critical care time does not reflect procedure time, or teaching time or supervisory time of PA/NP/Med student/Med Resident etc but could involve care discussion time. Rest per NP/medical resident whose note is outlined above and that I agree with   Mcarthur Rossetti. Tyson Alias, MD, FACP Pgr: [phone] [facility] 04/01/2015 11:04 AM   I have had extensive discussions with family all children. We discussed patients current circumstances and organ failures. We also  discussed patient's prior wishes under circumstances such as this. Family has decided to NOT perform resuscitation if arrest but to continue current medical support for now. If decline - comfort  Mcarthur Rossetti. Tyson Alias, MD, FACP Pgr: [phone] [facility]

## 2015-04-01 NOTE — Progress Notes (Signed)
ANTICOAGULATION CONSULT NOTE - Follow-up Consult  Pharmacy Consult for heparin  Indication: atrial fibrillation  No Known Allergies  Patient Measurements: Height:  (172.7 cm) Weight: 132 lb 15 oz (60.3 kg) IBW/kg (Calculated) : 63.9   Vital Signs: Temp: 99.5 F (37.5 C) (12/19 2215) Temp Source: Core (Comment) (12/19 2200) BP: 129/93 mmHg (12/19 2200) Pulse Rate: 140 (12/19 2200)  Labs:  Recent Labs  2015-04-12 2336 Apr 12, 2015 2337  04/01/15 0315  04/01/15 0920 04/01/15 1020 04/01/15 1230 04/01/15 1315 04/01/15 1510 04/01/15 1859 04/01/15 2100 04/01/15 2200  HGB  --   --   < > 7.8*  < >  --  9.3*  --   --  9.6* 9.7*  --   --   HCT  --   --   < > 24.8*  < >  --  29.6*  --   --  29.8* 30.6*  --   --   PLT  --   --   --  240  < >  --  260  --   --  254 255  --   --   APTT >200*  --   --   --   --   --   --   --   --   --   --   --   --   LABPROT  --  18.8*  --   --   --   --   --   --   --   --   --   --   --   INR  --  1.57*  --   --   --   --   --   --   --   --   --   --   --   HEPARINUNFRC  --   --   --   --   --   --   --  0.24*  --   --   --   --  0.29*  CREATININE 1.74*  --   < > 2.02*  < > 2.09*  --   --  2.08*  --   --  2.10*  --   TROPONINI 0.10*  --   --  0.19*  --   --  0.32*  --   --   --   --   --   --   < > = values in this interval not displayed.  Estimated Creatinine Clearance: 17.3 mL/min (by C-G formula based on Cr of 2.1).   Medical History: Past Medical History  Diagnosis Date  . Diabetes mellitus without complication (HCC)   . Hypertension   . Elevated cholesterol   . Arthritis     Assessment: 3 YOF with acute arterial occlusion of left lower extremity s/p left femoral embolectomy and new onset Afib who continues on heparin for anticoagulation.  Heparin level is slightly SUBtherapeutic this evening (HL 0.29 << 0.24, goal of 0.3-0.5). It is noted that the patient has had some coffee ground emesis from OGT within the past 24 hours  however per RN report tonight, this appears to be resolving  - output still with some coffee ground appearance but starting to look more "bilious". CBC checks throughout they day have showed a stable Hgb - last check was 9.7 << 9.6. Will increase the drip rate slightly to get within range.   Goal of Therapy:  Heparin level 0.3-0.5 units/ml (in the setting of possible GIB)  Monitor platelets by anticoagulation protocol: Yes   Plan:  1. Increase heparin to 800 units/hr (8 ml/hr) 2. Will continue to monitor for any signs/symptoms of bleeding and will follow up with heparin level in 8 hours   Georgina PillionElizabeth Calder Oblinger, PharmD, BCPS Clinical Pharmacist Pager: [phone] 04/01/2015 10:31 PM

## 2015-04-01 NOTE — Progress Notes (Addendum)
Patient arrived on unit brief assessment completed. Patient arrived with arterial SBP in the 80's patients BPcontinued to decline, pt in AFIB with RVR.  CCM arrived, code called, central line inserted Patient intubated. See code sheet.

## 2015-04-01 NOTE — Progress Notes (Signed)
eLink Physician-Brief Progress Note Patient Name: Cynthia PolkaMary F Vandenberg DOB: Feb 07, 1924 MRN: [id number]   Date of Service  04/01/2015  HPI/Events of Note  Currently on a Heparin IV infusion s/p LE embolectomy. Now with the possibility of GI bleeding the question of holding the Heparin IV infusion comes up. There is no good answer in this situation.   eICU Interventions  Will continue the Heparin IV infusion for the present. However, may need to hold the Heparin infusion if she continues to GI bleed. Primary team to readdress when they round this morning.      Intervention Category Intermediate Interventions: Other:  Lenell AntuSommer,Steven Eugene 04/01/2015, 6:44 AM

## 2015-04-01 NOTE — Progress Notes (Signed)
ANTICOAGULATION CONSULT NOTE - Follow-up Consult  Pharmacy Consult for heparin  Indication: atrial fibrillation  No Known Allergies  Patient Measurements: Height: 5\' 8"  (172.7 cm) Weight: 132 lb 15 oz (60.3 kg) IBW/kg (Calculated) : 63.9   Vital Signs: Temp: 98.2 F (36.8 C) (12/19 1230) Temp Source: Core (Comment) (12/19 0800) BP: 64/48 mmHg (12/19 1200) Pulse Rate: 138 (12/19 1122)  Labs:  Recent Labs  Sep 22, 2014 2336 Sep 22, 2014 2337  04/01/15 0315 04/01/15 0441 04/01/15 0714 04/01/15 0920 04/01/15 1020 04/01/15 1230  HGB  --   --   < > 7.8*  --  9.2*  --  9.3*  --   HCT  --   --   < > 24.8*  --  29.4*  --  29.6*  --   PLT  --   --   --  240  --  280  --  260  --   APTT >200*  --   --   --   --   --   --   --   --   LABPROT  --  18.8*  --   --   --   --   --   --   --   INR  --  1.57*  --   --   --   --   --   --   --   HEPARINUNFRC  --   --   --   --   --   --   --   --  0.24*  CREATININE 1.74*  --   < > 2.02* 1.90*  --  2.09*  --   --   TROPONINI 0.10*  --   --  0.19*  --   --   --  0.32*  --   < > = values in this interval not displayed.  Estimated Creatinine Clearance: 17.4 mL/min (by C-G formula based on Cr of 2.09).   Medical History: Past Medical History  Diagnosis Date  . Diabetes mellitus without complication (HCC)   . Hypertension   . Elevated cholesterol   . Arthritis     Assessment: 81 yo female with acute arterial occlusion of left lower extremity s/p left femoral embolectomy. Currently on IV heparin at 650 units/hr. HL this AM sub-therapeutic at 0.24. She had experienced some coffee ground emesis from OGT last night which has improved. Hgb trended up to 9.3 today which was promising. Plt wnl.   Goal of Therapy:  Heparin level 0.3-0.7 units/ml Monitor platelets by anticoagulation protocol: Yes   Plan:  -Increase heparin to 750 units/hr -Heparin level in 8 hours  -Monitor closely for s/s of bleeding  -Daily heparin level and CBC  Vinnie LevelBenjamin  Jaquay Morneault, PharmD., BCPS Clinical Pharmacist Pager [phone]

## 2015-04-01 NOTE — Progress Notes (Signed)
Utilization review completed.  

## 2015-04-01 NOTE — Progress Notes (Signed)
eLink Physician-Brief Progress Note Patient Name: Earlean PolkaMary F Borre DOB: 11-09-1923 MRN: [id number]   Date of Service  04/01/2015  HPI/Events of Note  Mg++ = 1.4 and Creatinine = 1.9.  eICU Interventions  Will replete Mg++.      Intervention Category Intermediate Interventions: Electrolyte abnormality - evaluation and management  Benton Tooker Eugene 04/01/2015, 6:17 AM

## 2015-04-01 NOTE — Transfer of Care (Signed)
Immediate Anesthesia Transfer of Care Note  Patient: Cynthia Montes  Procedure(s) Performed: Procedure(s): Left Femoral Embolectomy with Bovine Pericardial Patch Angioplasty (Left)  Patient Location: PACU  Anesthesia Type:General  Level of Consciousness: sedated  Airway & Oxygen Therapy: Patient Spontanous Breathing and Patient connected to face mask oxygen  Post-op Assessment: Report given to RN and Post -op Vital signs reviewed and stable  Post vital signs: Reviewed and stable  Last Vitals:  Filed Vitals:   04/01/15 0039 04/01/15 0045  BP: 122/65   Pulse:  37  Temp:    Resp: 20 25    Complications: No apparent anesthesia complications

## 2015-04-01 NOTE — Progress Notes (Signed)
CRITICAL VALUE ALERT  Critical value received:  Latic acid= 9.3 and K= 2.7  Date of notification:  04/01/15  Time of notification:  1005  Critical value read back:Yes.    Nurse who received alert:  Tory EmeraldMichelle Daylen Lipsky, RN  MD notified (1st page):  Dr Wenda Lowarly Rivet  Time of first page:  1006  MD notified (2nd page):  Time of second page:  Responding MD:  Dr Rich Numberarly Rivet  Time MD responded:  1006

## 2015-04-01 NOTE — Op Note (Signed)
NAME: Cynthia Montes   MRN: [id number] DOB: 07/24/1923    DATE OF OPERATION: 04/01/2015  PREOP DIAGNOSIS: acute arterial occlusion left lower extremity  POSTOP DIAGNOSIS: same  PROCEDURE: left femoral embolectomy with bovine pericardial patch angioplasty  SURGEON: Judeth Cornfield. Scot Dock, MD, FACS  ASSIST: nnone  ANESTHESIA: Gen.   EBL: 100 cc  INDICATIONS: Cynthia Montes is a 81 y.o. female who developed the acute onset of left leg pain at approximately 3:30 today. She had a diminished femoral pulse and no Doppler flow in the left foot. The left lower extremity was mottled.  She had decreased motor and sensory function. She is brought for emergent embolectomy.  FINDINGS: organized clot removed from the common femoral artery and deep femoral artery. Also some fresh clot removed from the superficial femoral artery. Good posterior tibial signal at the completion of the procedure.  TECHNIQUE: The patient was taken to the operating room and received a general anesthetic. The right groin and entire left lower extremity were prepped and draped in the usual sterile fashion. A longitudinal incision was made in the left groin. Through this incision the common femoral, superficial femoral, deep femoral arteries were dissected free. The patient was heparinized. A longitudinal arteriotomy was made in the common femoral artery after the vessels were controlled. There was organized clot present within the common femoral artery and this was directly retrieved. I passed a 4 Fogarty catheter multiple times proximally and there was no clot retrieved. I passed the catheter down the deep femoral artery and more organized clot was retrieved. Multiple passes were made until no further clot was retrieved. The catheter passed 50 cm down the superficial femoral artery and some fresh clot was retrieved. The catheter consistently met obstruction at 50 cm. I also used a 3 Fogarty catheter with the same result. 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 Her creatinine was not back from the lab and I did not want to risk given her contrast and affecting her kidney function. She had a posterior tibial signal at the Doppler. Lower in the foot was improved. A 15 Blake drain was placed in the groin. I did not reverse her protamine. The wound was close the deeper 2-0 Vicryl. The subcutaneous layer was closed with 3-0 Vicryl. The skin was closed 4-0 subcutaneous stitch. Liquid bandage was applied. The patient tolerated the procedure well and was transferred to the recovery room in stable condition. All needle and sponge counts were correct.  Deitra Mayo, MD, FACS [facility]  DATE OF DICTATION:   04/01/2015

## 2015-04-01 NOTE — Anesthesia Postprocedure Evaluation (Signed)
Anesthesia Post Note  Patient: Cynthia Montes  Procedure(s) Performed: Procedure(s) (LRB): Left Femoral Embolectomy with Bovine Pericardial Patch Angioplasty (Left)  Patient location during evaluation: PACU Anesthesia Type: General Level of consciousness: sedated Pain management: pain level controlled Vital Signs Assessment: post-procedure vital signs reviewed and stable Respiratory status: spontaneous breathing Cardiovascular status: stable Anesthetic complications: no    Last Vitals:  Filed Vitals:   04/01/15 1945 04/01/15 2000  BP:  132/116  Pulse:  145  Temp: 37.4 C 37.5 C  Resp: 23 22    Last Pain:  Filed Vitals:   04/01/15 2003  PainSc: 0-No pain                 Lewie LoronJohn Makailah Slavick

## 2015-04-01 NOTE — Progress Notes (Signed)
ANTICOAGULATION CONSULT NOTE - Follow-up Consult  Pharmacy Consult for heparin  Indication: atrial fibrillation  No Known Allergies  Patient Measurements: Height: 5\' 8"  (172.7 cm) Weight: 120 lb (54.432 kg) IBW/kg (Calculated) : 63.9   Vital Signs: Temp: 98.5 F (36.9 C) (12/18 2052) Temp Source: Oral (12/18 2052) BP: 88/72 mmHg (12/18 2215) Pulse Rate: 64 (12/18 2130)  Labs:  Recent Labs  13-Mar-2015 2121 13-Mar-2015 2336  HGB 10.8*  --   HCT 34.2*  --   PLT 279  --   CREATININE  --  1.74*  TROPONINI  --  0.10*    Estimated Creatinine Clearance: 18.8 mL/min (by C-G formula based on Cr of 1.74).   Medical History: Past Medical History  Diagnosis Date  . Diabetes mellitus without complication (HCC)   . Hypertension   . Elevated cholesterol   . Arthritis     Assessment: 81 yo female with left leg pain and noteddistal arterial emboli to Left leg from new onset AFib. Pharmacy has been consulted to dose heparin - heparin ordered but not started pre-op. S/p  L femoral embolectomy 12/18 p.m. To start heparin without bolus 12/19 0400 per MD post-op orders. CBC ok at baseline. Estimated CrCl 18 ml/min.  Goal of Therapy:  Heparin level 0.3-0.7 units/ml Monitor platelets by anticoagulation protocol: Yes   Plan:  -D/c heparin bolus -At 0400, start heparin 650 units/hr -Heparin level in 8 hours  -Daily heparin level and CBC  Christoper Fabianaron Ayako Tapanes, PharmD, BCPS Clinical pharmacist, pager [phone]  04/01/2015 12:46 AM

## 2015-04-01 NOTE — Progress Notes (Signed)
  Echocardiogram 2D Echocardiogram has been performed.  Leta JunglingCooper, Teela Narducci M 04/01/2015, 11:35 AM

## 2015-04-01 NOTE — Progress Notes (Signed)
CRITICAL VALUE ALERT  Critical value received:  LA 10.7  Date of notification:  04/01/2015  Time of notification:  0654  Critical value read back:Yes.    Nurse who received alert:  Lillia CorporalHancock, Geraldyne Elizabeth  MD notified (1st page):  Dr. Arsenio LoaderSommer  Time of first page:  [phone]  MD notified (2nd page):  Time of second page:  Responding MD:  Dr. Arsenio LoaderSommer  Time MD responded:  [phone]

## 2015-04-01 NOTE — Progress Notes (Signed)
ANTIBIOTIC CONSULT NOTE - INITIAL  Pharmacy Consult for Ceftazidime and Vancomycin Indication: sepsis  No Known Allergies  Patient Measurements: Height: 5\' 8"  (172.7 cm) Weight: 120 lb (54.432 kg) IBW/kg (Calculated) : 63.9  Vital Signs: Temp: 93.8 F (34.3 C) (12/19 0200) Temp Source: Axillary (12/19 0200) BP: 110/87 mmHg (12/19 0143) Pulse Rate: 146 (12/19 0330) Intake/Output from previous day: 12/18 0701 - 12/19 0700 In: 2255 [I.V.:1605; IV Piggyback:500] Out: 350 [Urine:250; Blood:100] Intake/Output from this shift: Total I/O In: 2255 [I.V.:1605; Other:150; IV Piggyback:500] Out: 350 [Urine:250; Blood:100]  Labs:  Recent Labs  03/30/2015 2121 03/25/2015 2336 04/01/15 0006 04/01/15 0315  WBC 20.4*  --   --  13.5*  HGB 10.8*  --  8.5* 7.8*  PLT 279  --   --  240  CREATININE  --  1.74*  --  2.02*   Estimated Creatinine Clearance: 16.2 mL/min (by C-G formula based on Cr of 2.02). No results for input(s): VANCOTROUGH, VANCOPEAK, VANCORANDOM, GENTTROUGH, GENTPEAK, GENTRANDOM, TOBRATROUGH, TOBRAPEAK, TOBRARND, AMIKACINPEAK, AMIKACINTROU, AMIKACIN in the last 72 hours.   Microbiology: Recent Results (from the past 720 hour(s))  MRSA PCR Screening     Status: None   Collection Time: 04/01/15  2:09 AM  Result Value Ref Range Status   MRSA by PCR NEGATIVE NEGATIVE Final    Comment:        The GeneXpert MRSA Assay (FDA approved for NASAL specimens only), is one component of a comprehensive MRSA colonization surveillance program. It is not intended to diagnose MRSA infection nor to guide or monitor treatment for MRSA infections.     Medical History: Past Medical History  Diagnosis Date  . Diabetes mellitus without complication (HCC)   . Hypertension   . Elevated cholesterol   . Arthritis     Medications:  Prescriptions prior to admission  Medication Sig Dispense Refill Last Dose  . amLODipine (NORVASC) 5 MG tablet Take 5 mg by mouth daily.   03/23/2015  .  aspirin EC 81 MG tablet Take 81 mg by mouth daily.   04/01/2015  . atorvastatin (LIPITOR) 10 MG tablet Take 10 mg by mouth daily.   03/30/2015  . enalapril (VASOTEC) 20 MG tablet Take 20 mg by mouth 2 (two) times daily.   03/20/2015  . galantamine (RAZADYNE ER) 16 MG 24 hr capsule Take 16 mg by mouth daily with breakfast.   03/30/2015  . glimepiride (AMARYL) 1 MG tablet Take 1 mg by mouth daily with breakfast.   03/25/2015  . naproxen sodium (ANAPROX) 220 MG tablet Take 440 mg by mouth daily as needed (general pain).   04/07/2015 at Unknown time   Assessment: 81 y.o. F known to pharmacy from heparin dosing this admission. Pt now with septic shock and to begin broad spectrum abx Elita Quick(Fortaz and Vancomycin) for sepsis. Cultures pending. WBC elevated to 13.5. Hypothermic. LA 10.8. SCr 2.02, est CrCl 16 ml/min. Pt also on azithromycin.  Goal of Therapy:  Vancomycin trough level 15-20 mcg/ml  Plan:  Ceftazidime 1gm IV q24h Vancomycin 750mg  IV q48h Will f/u micro data, renal function, and pt's clinical condition Vanc trough prn  Christoper Fabianaron Ezequias Lard, PharmD, BCPS Clinical pharmacist, pager [phone] 04/01/2015,4:51 AM

## 2015-04-01 NOTE — Progress Notes (Signed)
[facility] Progress Note  VASCULAR SURGERY ASSESSMENT AND PLAN:  Agree with no below. The patient has deteriorated significantly and is now on maximum pressors. The right lower extremity is now mottled. I think this is related to her pressors and her known multilevel severe infrainguinal arterial occlusive disease. I do not think that there are any options on the right except trying to wean her pressors. I have had a long discussion with the family and they understand that she may not survive this. At this point is not too much to offer from a vascular standpoint except to try to wean the pressors and to continue her heparin.  Waverly Ferrarihristopher Treana Lacour, MD, FACS Beeper [phone] Office: [phone]   Subjective  - POD #1  Sedated on vent  Objective Filed Vitals:   04/01/15 0630 04/01/15 0645  BP: 114/67 114/65  Pulse:    Temp: 94.3 F (34.6 C) 94.3 F (34.6 C)  Resp: 27 27    Intake/Output Summary (Last 24 hours) at 04/01/15 0741 Last data filed at 04/01/15 0500  Gross per 24 hour  Intake 6259.98 ml  Output    350 ml  Net 5909.98 ml   Left posterior tibial doppler flow. Left groin incision clean. JP drain with <25cc sanguinous output Palpable right femoral pulse. Right foot pale. Unable to doppler flow right foot.   Assessment/Planning: 81 y.o. female is s/p: left femoral embolectomy with bovine pericardial patch angioplasty  1 Day Post-Op   Patient on max pressors: levophed, neo, vasopressin.  Acute arterial occlusion left lower extremity secondary: likely embolic event from new onset atrial fibrillation. Good doppler flow this am. Continue heparin drip. Amiodarone drip.  Right foot with no doppler flow and appears slightly. Discussed with Dr. Edilia Boickson who said this is unchanged from previous doppler exam. Continue to monitor.   Raymond GurneyKimberly A Trinh 04/01/2015 7:41 AM --  Laboratory CBC    Component Value Date/Time   WBC 12.5* 04/01/2015 0714   HGB  9.2* 04/01/2015 0714   HCT 29.4* 04/01/2015 0714   PLT 280 04/01/2015 0714    BMET    Component Value Date/Time   NA 138 04/01/2015 0441   K 3.6 04/01/2015 0441   CL 102 04/01/2015 0441   CO2 18* 04/01/2015 0441   GLUCOSE 531* 04/01/2015 0441   BUN 21* 04/01/2015 0441   CREATININE 1.90* 04/01/2015 0441   CALCIUM 8.3* 04/01/2015 0441   GFRNONAA 22* 04/01/2015 0441   GFRAA 26* 04/01/2015 0441    COAG Lab Results  Component Value Date   INR 1.57* 07-01-2014   No results found for: PTT  Antibiotics Anti-infectives    Start     Dose/Rate Route Frequency Ordered Stop   04/01/15 0500  azithromycin (ZITHROMAX) 500 mg in dextrose 5 % 250 mL IVPB     500 mg 250 mL/hr over 60 Minutes Intravenous Every 24 hours 04/01/15 0428     04/01/15 0500  cefTAZidime (FORTAZ) 1 g in dextrose 5 % 50 mL IVPB     1 g 100 mL/hr over 30 Minutes Intravenous Every 24 hours 04/01/15 0458     04/01/15 0500  vancomycin (VANCOCIN) IVPB 750 mg/150 ml premix     750 mg 150 mL/hr over 60 Minutes Intravenous Every 48 hours 04/01/15 0458     04/01/15 0000  ceFAZolin (ANCEF) IVPB 1 g/50 mL premix    Comments:  Send with pt to OR   1 g 100 mL/hr over 30 Minutes Intravenous On call 2014-05-02  2159 April 25, 2015 0000       Maris Berger, PA-C [facility] Office: [phone] Pager: [phone] 04/01/2015 7:41 AM

## 2015-04-02 LAB — CBC
HEMATOCRIT: 29.7 % — AB (ref 36.0–46.0)
Hemoglobin: 9.6 g/dL — ABNORMAL LOW (ref 12.0–15.0)
MCH: 26.8 pg (ref 26.0–34.0)
MCHC: 32.3 g/dL (ref 30.0–36.0)
MCV: 83 fL (ref 78.0–100.0)
PLATELETS: 241 10*3/uL (ref 150–400)
RBC: 3.58 MIL/uL — AB (ref 3.87–5.11)
RDW: 13.3 % (ref 11.5–15.5)
WBC: 19.8 10*3/uL — AB (ref 4.0–10.5)

## 2015-04-02 LAB — TYPE AND SCREEN
ABO/RH(D): A POS
Antibody Screen: NEGATIVE
UNIT DIVISION: 0
UNIT DIVISION: 0

## 2015-04-02 LAB — RESPIRATORY VIRUS PANEL
Adenovirus: NEGATIVE
Influenza A: NEGATIVE
Influenza B: NEGATIVE
Metapneumovirus: NEGATIVE
PARAINFLUENZA 2 A: NEGATIVE
PARAINFLUENZA 3 A: NEGATIVE
Parainfluenza 1: NEGATIVE
RHINOVIRUS: NEGATIVE
Respiratory Syncytial Virus A: NEGATIVE
Respiratory Syncytial Virus B: NEGATIVE

## 2015-04-02 LAB — GLUCOSE, CAPILLARY
GLUCOSE-CAPILLARY: 100 mg/dL — AB (ref 65–99)
GLUCOSE-CAPILLARY: 110 mg/dL — AB (ref 65–99)
GLUCOSE-CAPILLARY: 63 mg/dL — AB (ref 65–99)
Glucose-Capillary: 109 mg/dL — ABNORMAL HIGH (ref 65–99)
Glucose-Capillary: 50 mg/dL — ABNORMAL LOW (ref 65–99)
Glucose-Capillary: 71 mg/dL (ref 65–99)
Glucose-Capillary: 84 mg/dL (ref 65–99)
Glucose-Capillary: 90 mg/dL (ref 65–99)
Glucose-Capillary: 94 mg/dL (ref 65–99)

## 2015-04-02 LAB — POCT I-STAT 3, ART BLOOD GAS (G3+)
Bicarbonate: 22.3 mEq/L (ref 20.0–24.0)
O2 Saturation: 95 %
PH ART: 7.476 — AB (ref 7.350–7.450)
TCO2: 23 mmol/L (ref 0–100)
pCO2 arterial: 30.3 mmHg — ABNORMAL LOW (ref 35.0–45.0)
pO2, Arterial: 70 mmHg — ABNORMAL LOW (ref 80.0–100.0)

## 2015-04-02 LAB — BASIC METABOLIC PANEL
ANION GAP: 12 (ref 5–15)
BUN: 25 mg/dL — ABNORMAL HIGH (ref 6–20)
CALCIUM: 7.3 mg/dL — AB (ref 8.9–10.3)
CHLORIDE: 91 mmol/L — AB (ref 101–111)
CO2: 22 mmol/L (ref 22–32)
Creatinine, Ser: 2.33 mg/dL — ABNORMAL HIGH (ref 0.44–1.00)
GFR calc non Af Amer: 17 mL/min — ABNORMAL LOW (ref >60)
GFR, EST AFRICAN AMERICAN: 20 mL/min — AB (ref >60)
GLUCOSE: 114 mg/dL — AB (ref 65–99)
Potassium: 4.8 mmol/L (ref 3.5–5.1)
Sodium: 125 mmol/L — ABNORMAL LOW (ref 135–145)

## 2015-04-02 LAB — LEGIONELLA ANTIGEN, URINE

## 2015-04-02 LAB — TROPONIN I: Troponin I: 0.8 ng/mL (ref <0.031)

## 2015-04-02 LAB — CORTISOL: Cortisol, Plasma: >100 ug/dL

## 2015-04-02 LAB — T3, FREE: T3 FREE: 2 pg/mL (ref 2.0–4.4)

## 2015-04-02 LAB — HEMOGLOBIN A1C
Hgb A1c MFr Bld: 7.3 % — ABNORMAL HIGH (ref 4.8–5.6)
Mean Plasma Glucose: 163 mg/dL

## 2015-04-02 MED ORDER — DEXTROSE 50 % IV SOLN
20.0000 mL | Freq: Once | INTRAVENOUS | Status: AC
  Administered 2015-04-02: 20 mL via INTRAVENOUS

## 2015-04-02 MED ORDER — DEXTROSE-NACL 5-0.9 % IV SOLN
INTRAVENOUS | Status: DC
  Administered 2015-04-02: 02:00:00 via INTRAVENOUS

## 2015-04-02 MED ORDER — DEXTROSE 5 % IV SOLN: INTRAVENOUS | Status: DC

## 2015-04-02 MED FILL — Medication: Qty: 1 | Status: AC

## 2015-04-03 LAB — CULTURE, RESPIRATORY W GRAM STAIN: Culture: NORMAL

## 2015-04-03 LAB — URINE CULTURE
Culture: >=100000
SPECIAL REQUESTS: NORMAL

## 2015-04-03 LAB — CULTURE, RESPIRATORY

## 2015-04-04 ENCOUNTER — Telehealth: Payer: Self-pay

## 2015-04-04 NOTE — Telephone Encounter (Signed)
On 04/04/2015 I received a death certificate from [employer] (original). The death certificate is for cremation. The patient is a patient of Doctor Tyson AliasFeinstein. [facility]. On 04/05/2015 I received the death certificate back from Doctor Tyson AliasFeinstein. I got the death certificate ready and called the funeral home to let them know the death certificate is ready for pickup.

## 2015-04-06 LAB — CULTURE, BLOOD (ROUTINE X 2): CULTURE: NO GROWTH

## 2015-04-14 NOTE — Progress Notes (Signed)
With daughter at the bedside patient went asystole at [id number]. Pupils fixed non-reactive,no heart sounds no breath sounds. Pt full DNR. Dr Dellie CatholicSommers notified. [employer] [facility] [facility] called pt not a donor. Awaiting other family members to arrive.

## 2015-04-14 NOTE — Discharge Summary (Addendum)
NAME:  Cynthia Montes, Cynthia Montes                ACCOUNT NO.:  [id number]  MEDICAL RECORD NO.:  [id number]  LOCATION:  2M06C                        FACILITY:  [facility]  PHYSICIAN:  Nelda Bucksaniel J Feinstein, MD DATE OF BIRTH:  [birth year]  DATE OF ADMISSION:  03/27/2015 DATE OF DISCHARGE:  2015-03-10                              DISCHARGE SUMMARY   DEATH SUMMARY  This is an 82 year old white female with past medical history includes diabetes, peripheral vascular disease and hypertension, [facility] on March 31, 2015, with acute onset of leg pain.  Especially, the patient was found to have a total occlusion of left femoral and lower extremity, for which, she went emergently to the operating room by Dr. Waverly Ferrarihristopher Dickson and had thrombectomy performed.  The patient to return to the intensive care unit today with worsening acidosis and high pressor requirement secondary to multiorgan dysfunction syndrome.  There was also concern of pneumonia versus acute lung injury.  The patient then developed mottling of the right lower extremity, likely secondary to hypoperfusion secondary to significant pressor requirements.  Family was updated in the patient's multiorgan failure.  Renal ultrasound was ordered secondary to renal failure.  Cultures were obtained and the patient received ceftazidime, vancomycin and azithromycin.  The patient remained full no code blue given worsening clinical status and likely poor prognosis and despite maximum therapy on 75+ __________ high ventilatory __________.  She had continued lactic acidosis and worsening ischemia and expired.  FINAL DIAGNOSES UPON DEATH: 1. Acute left lower extremity limb ischemia, status post thrombectomy. 2. Shock, multiorgan dysfunction syndrome. 3. Hypoperfusion of the right lower extremity. 4. Acute respiratory failure. 5. Refractory lactic acidosis. 6. Refractory shock. 7. Sepsis 8. uti e  coli     Nelda Bucksaniel J Feinstein, MD     DJF/MEDQ  D:  04/03/2015  T:  04/04/2015  Job:  [id number]

## 2015-04-14 NOTE — Progress Notes (Signed)
eLink Physician-Brief Progress Note Patient Name: Cynthia PolkaMary F Montes DOB: 11-14-1923 MRN: [id number]   Date of Service  10-31-2014  HPI/Events of Note  Hypotension - BP = 54/36 and CVP = 18. Currently on Norepinephrine at 48 mcg/min. Norepinephrine now being titrated back up.  eICU Interventions  Will order: 1. 12 Lead EKG now. 2. Cycle Troponins. 2. ABG now.       Intervention Category Major Interventions: Hypotension - evaluation and management  Sommer,Steven Eugene 10-31-2014, 5:04 AM

## 2015-04-14 NOTE — Progress Notes (Signed)
PCCM INTERVAL PROGRESS NOTE  [facility] patient for worsening shock despite maximum dose vasopressors. Patient was made a DNR status during the day today. Bedside ultrasonography was performed and she had bilateral positive sliding lung sign, adequate volume status. SBP in the 40s. ABG revealed normal pH. Family at bedside and wishes to stop pressors at this time.   Joneen RoachPaul Khalis Hittle, AGACNP-BC [facility] Pager [phone] or [phone]  03/15/2015 5:44 AM

## 2015-04-14 NOTE — Progress Notes (Signed)
eLink Physician-Brief Progress Note Patient Name: Cynthia Montes DOB: 08-02-1923 MRN: [id number]   Date of Service  04-11-15  HPI/Events of Note  Multiple Issues: 1. Hypoglycemia - blood glucose = 71 and 2. Na+ = 130. Patient on insulin IV infusion with decreasing vasopressor requirements. Vasopressors were mixed in D5W.  eICU Interventions  Will order: 1. Change IV fluid to D5 0.9 NaCl at 75 mL/hour.      Intervention Category Major Interventions: Other: Intermediate Interventions: Electrolyte abnormality - evaluation and management  [name] 04-11-15, 1:32 AM

## 2015-04-14 DEATH — deceased

## 2017-07-14 IMAGING — US US ABDOMEN COMPLETE
1 series · 13 of 25 positions shown · non-contrast
Comparison: None.

CLINICAL DATA: 89-year-old with elevated serum transaminase.
Surgical history includes cholecystectomy. Current history of
hypertension, diabetes and hypercholesterolemia.

EXAM:
ABDOMEN ULTRASOUND COMPLETE

[Series 1: us abdomen complete · 0.26mm/px · 13 of 73 slices shown]
[im 1/73]
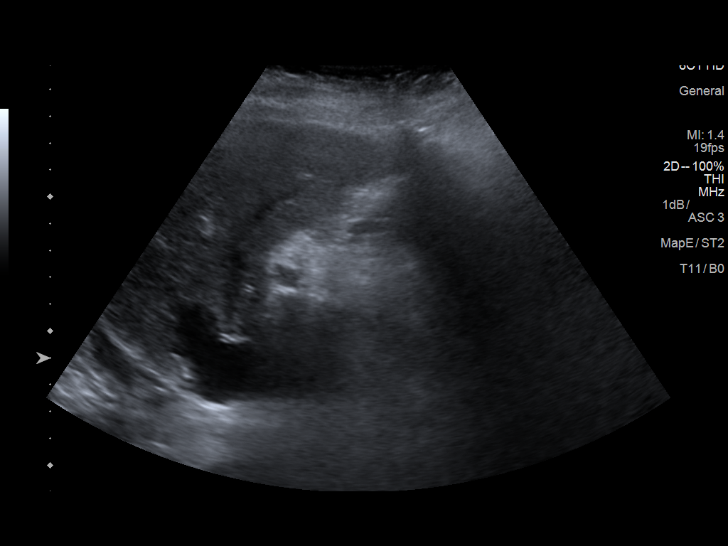
[im 7/73]
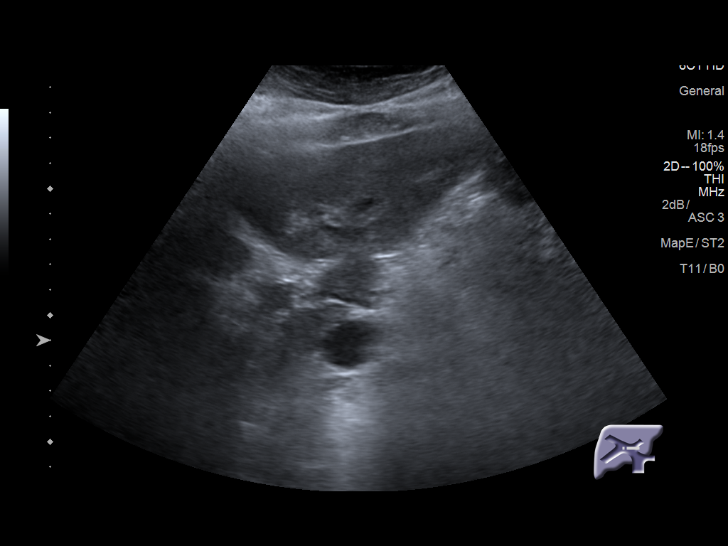
[im 13/73]
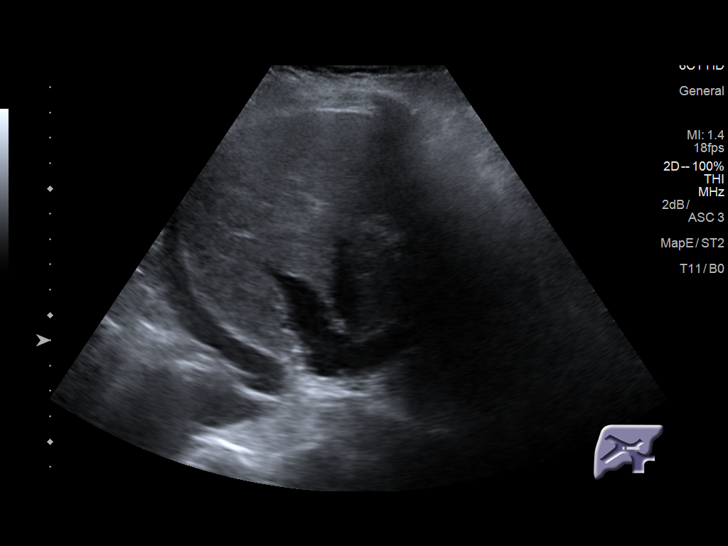
[im 19/73]
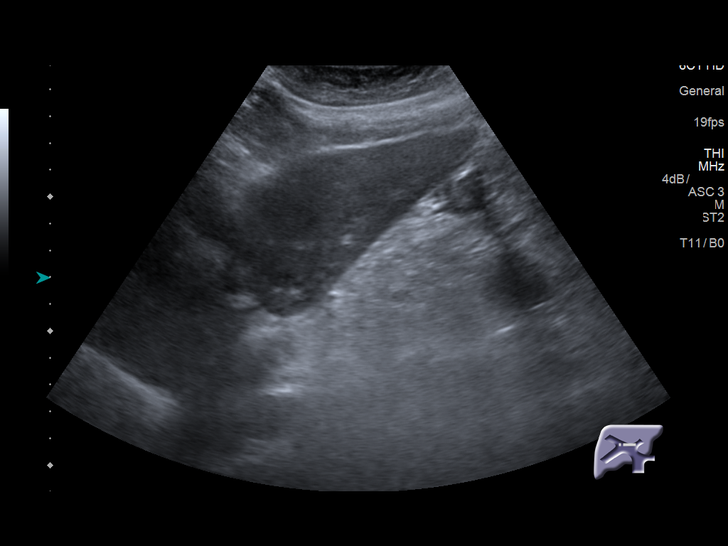
[im 25/73]
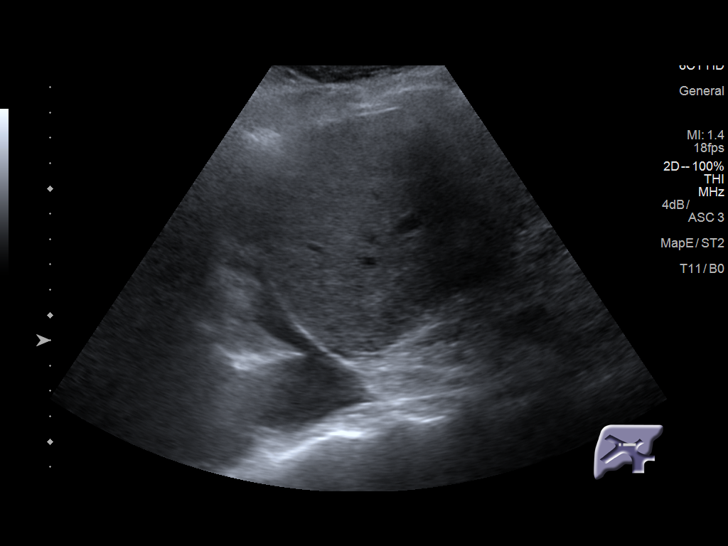
[im 31/73]
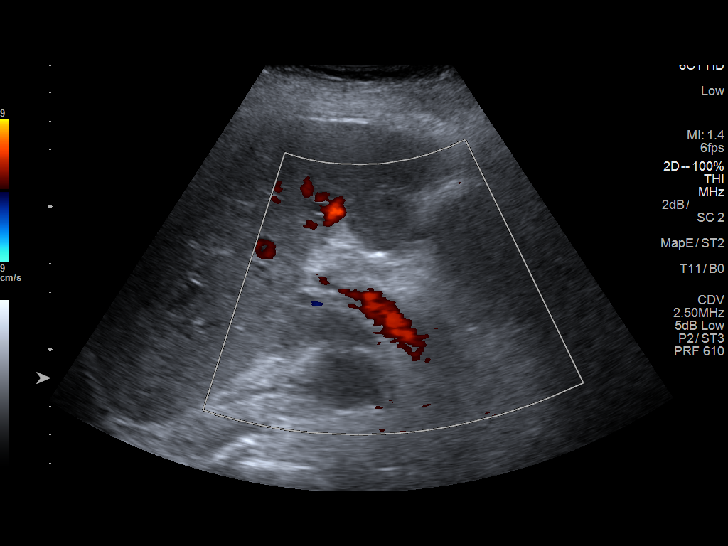
[im 37/73]
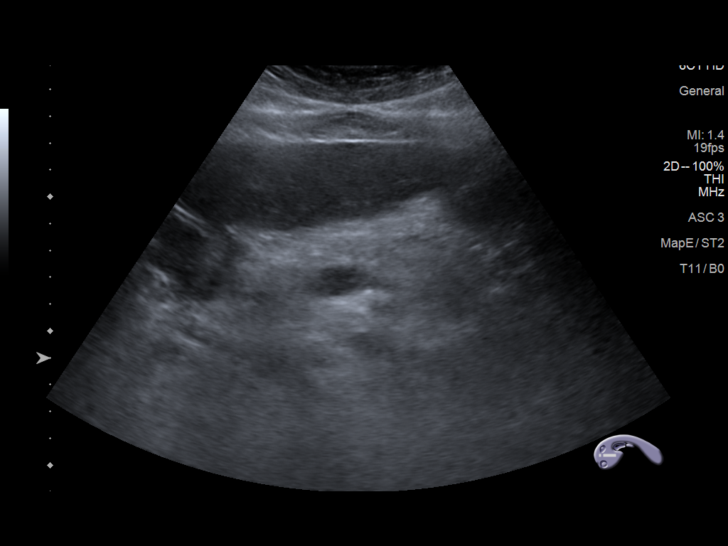
[im 43/73]
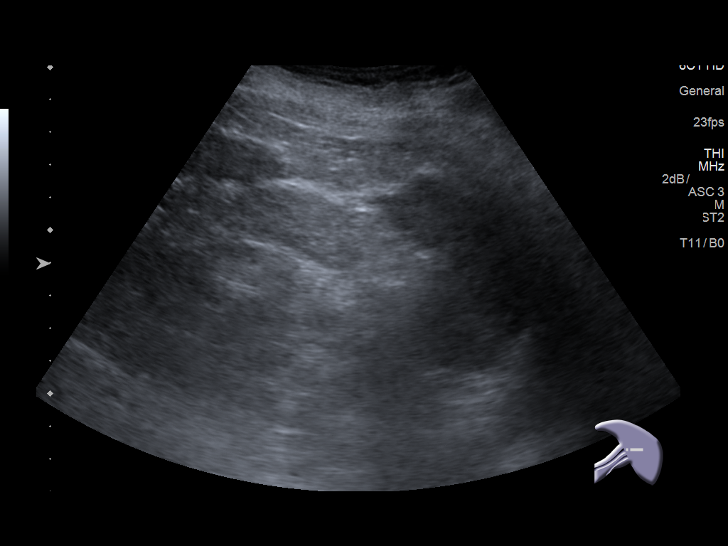
[im 49/73]
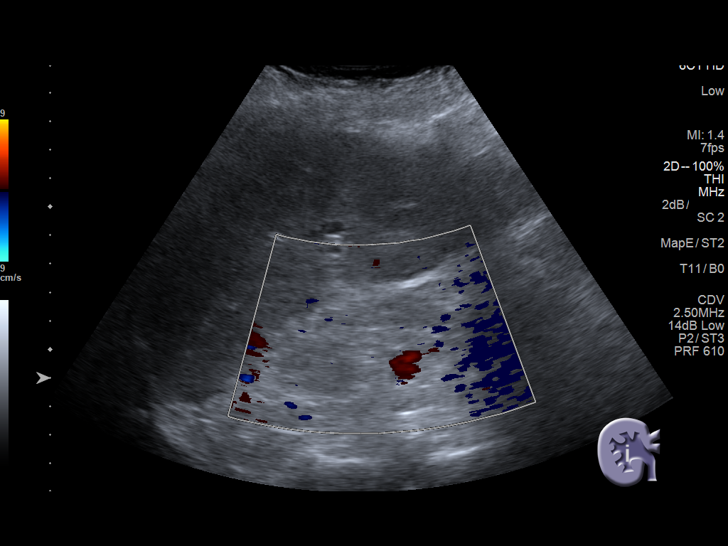
[im 55/73]
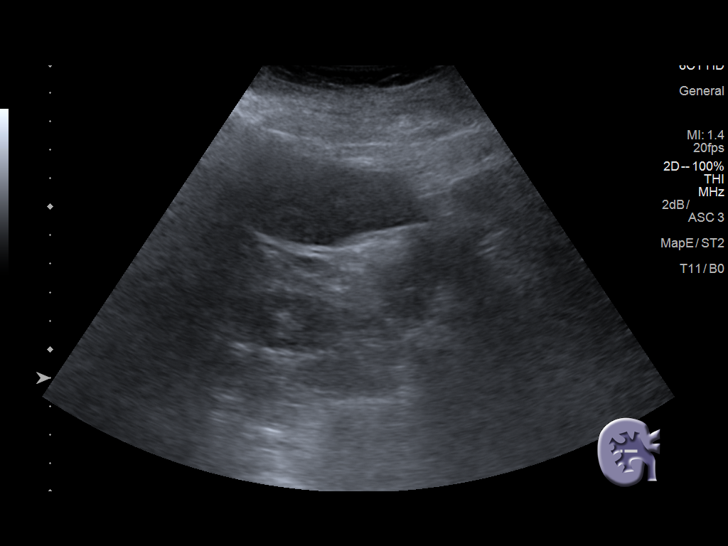
[im 61/73]
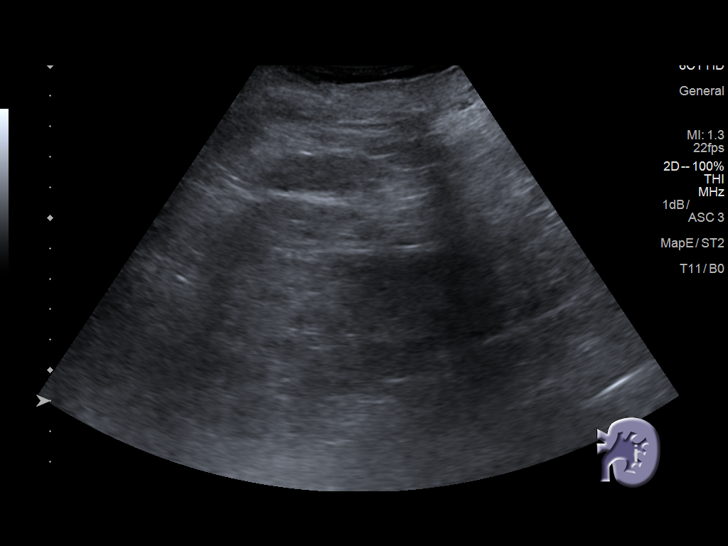
[im 67/73]
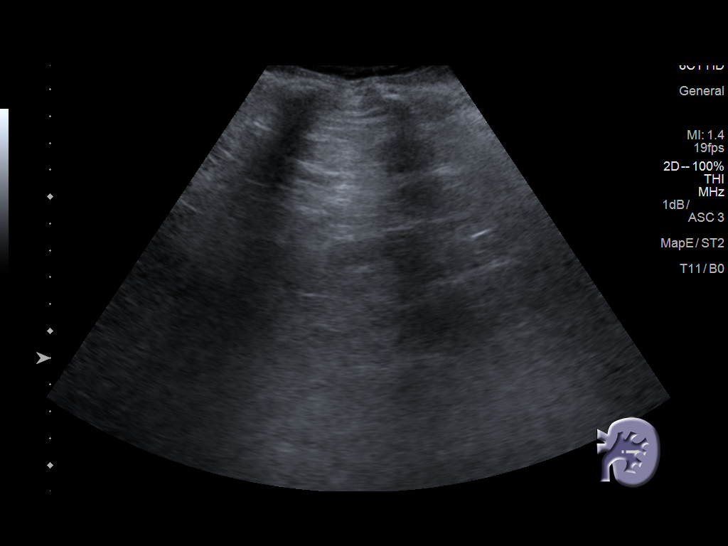
[im 73/73]
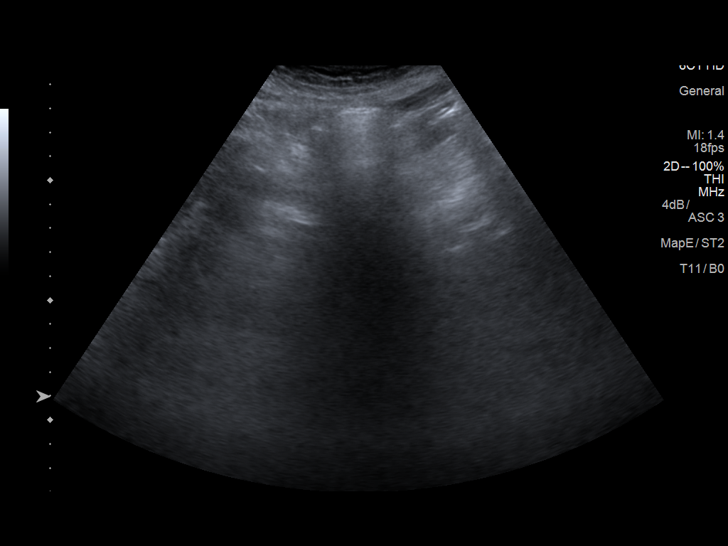

[13 of 25 positions shown; findings below may reference images not displayed]

FINDINGS: Gallbladder: Surgically absent.

Common bile duct: Diameter: Approximately 5 mm.

Liver: Normal size and echotexture without focal parenchymal
abnormality. Patent portal vein with hepatopetal flow.

IVC: Patent.

Pancreas: While difficult to visualize in its entirety, visualized
portions normal in appearance. The distal tail was obscured by
overlying bowel gas.

Spleen: Normal size and echotexture without focal parenchymal
abnormality.

Right Kidney: Length: Approximately 9.2 cm. No hydronephrosis. Mild
age related diffuse cortical thinning. No shadowing calculi. Normal
parenchymal echotexture. No focal parenchymal abnormality.

Left Kidney: Length: Approximately 9.5 cm. No hydronephrosis. Mild
age-related diffuse cortical thinning. No shadowing calculi. Normal
parenchymal echotexture. No focal parenchymal abnormality.

Abdominal aorta: Normal in caliber throughout its visualized course
in the abdomen without evidence of significant atherosclerosis.
Maximum diameter 2.0 cm. The distal aorta was obscured by overlying
bowel gas.

Other findings: Small right pleural effusion.
IMPRESSION: 1. No acute or significant abdominal abnormalities post
cholecystectomy. Specifically, normal-appearing liver by ultrasound
and no biliary ductal dilation.
2. Mild a related diffuse cortical thinning involving both kidneys.
3. Nonvisualization of the distal tail of the pancreas.
4. Small right pleural effusion.

## 2017-09-01 IMAGING — CR DG CHEST 1V PORT
1 series · 1 of 1 positions shown · non-contrast
Comparison: None.

CLINICAL DATA: Severe left leg pain from hip to knee

EXAM:
PORTABLE CHEST 1 VIEW

[AP]
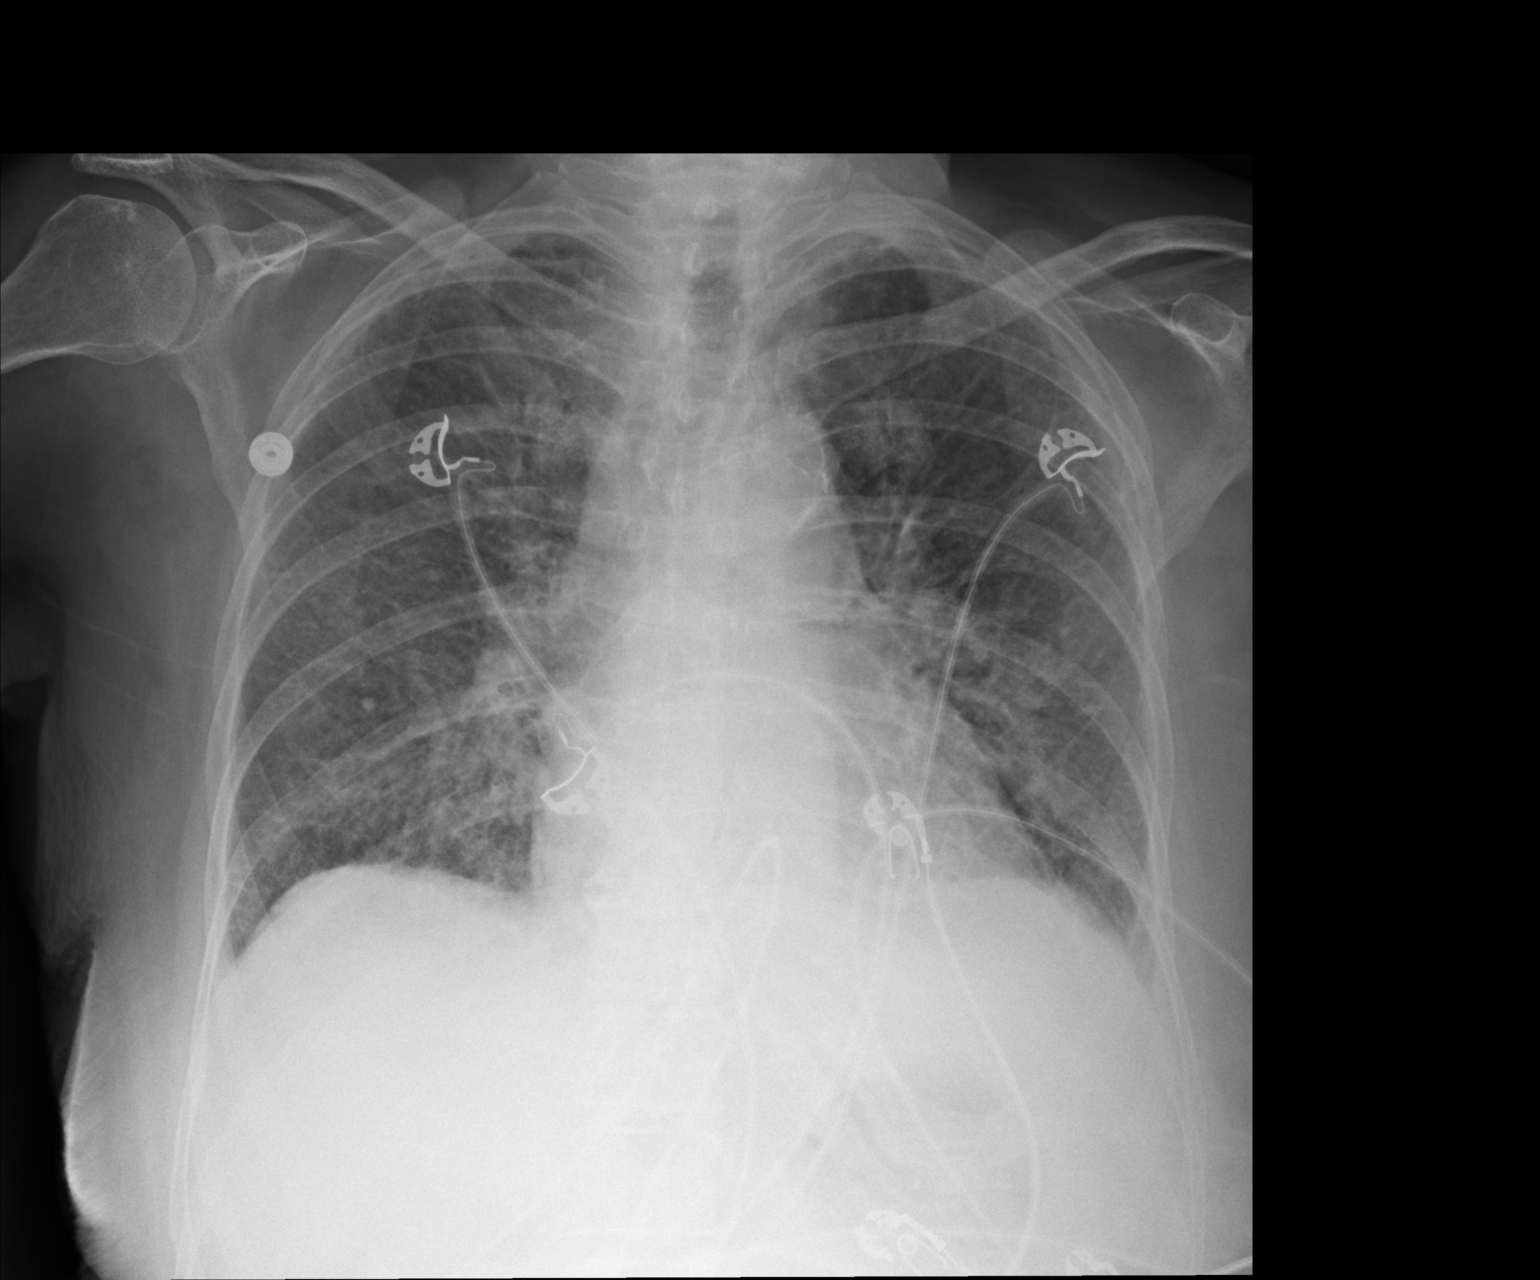

[1 of 1 positions shown; findings below may reference images not displayed]

FINDINGS: Normal heart size and mediastinal contours. There is interstitial
coarsening at the bases with subtle Kerley lines seen peripherally.
No effusion or air leak. Symmetric biapical pleural scarring.
IMPRESSION: Mild bronchitic or congestive interstitial coarsening.

## 2017-09-02 IMAGING — CR DG CHEST 1V PORT
1 series · 1 of 1 positions shown · non-contrast
Comparison: Yesterday

CLINICAL DATA: Central line complication.

EXAM:
PORTABLE CHEST 1 VIEW

[AP]
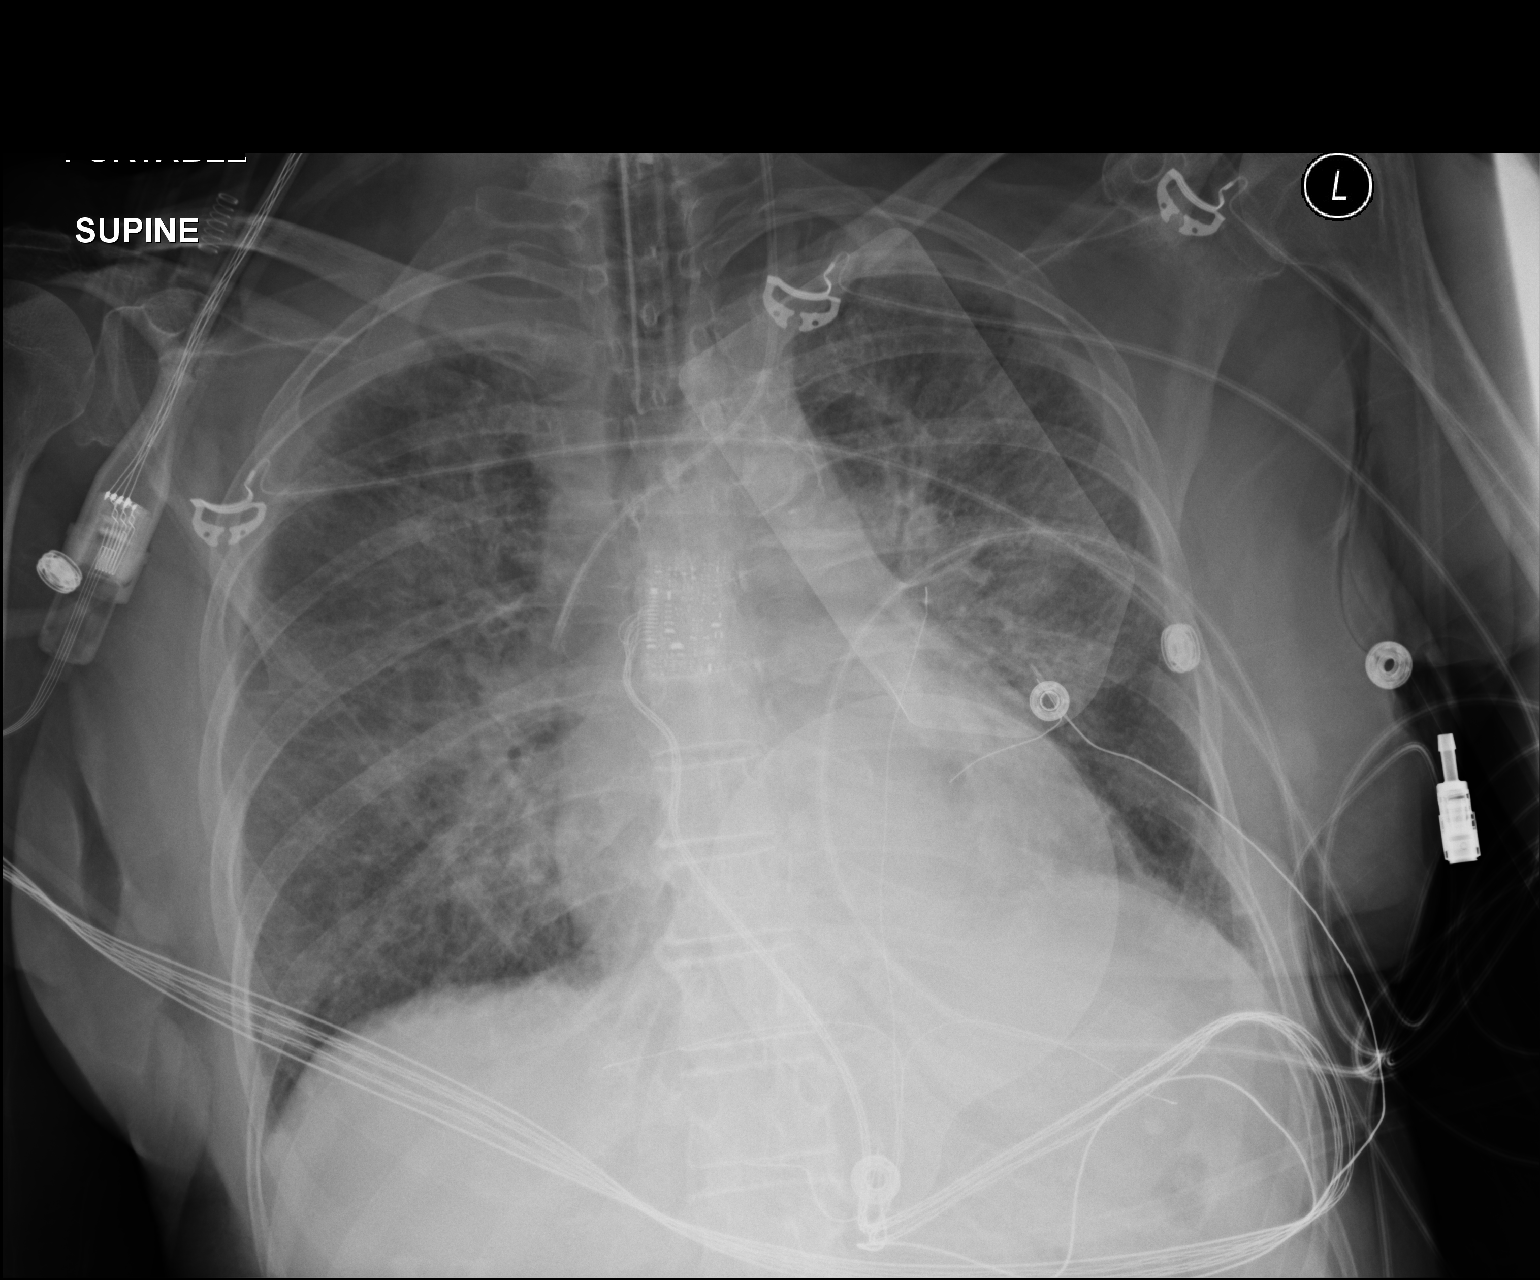

[1 of 1 positions shown; findings below may reference images not displayed]

FINDINGS: New left IJ central line, tip at the SVC level. No new mediastinal
widening or pneumothorax.

New endotracheal tube with tip just below the clavicular heads.

Normal heart size and stable mediastinal contours. Progressive
diffuse interstitial coarsening with cephalized flow. Possible
layering pleural fluid capping the apices.
IMPRESSION: 1. New central line without adverse finding.
2. New endotracheal tube in good position.
3. Progressed pulmonary edema.
